# Patient Record
Sex: Male | Born: 1935 | Race: White | Hispanic: No | Marital: Married | Smoking: Former smoker
Health system: Southern US, Community
[De-identification: ages and names within clinical notes are randomized; demographics above are authoritative.]

## PROBLEM LIST (undated history)

## (undated) DIAGNOSIS — C61 Malignant neoplasm of prostate: Secondary | ICD-10-CM

## (undated) DIAGNOSIS — I10 Essential (primary) hypertension: Secondary | ICD-10-CM

## (undated) DIAGNOSIS — N289 Disorder of kidney and ureter, unspecified: Secondary | ICD-10-CM

## (undated) DIAGNOSIS — I4892 Unspecified atrial flutter: Secondary | ICD-10-CM

## (undated) DIAGNOSIS — F0390 Unspecified dementia without behavioral disturbance: Secondary | ICD-10-CM

## (undated) DIAGNOSIS — H3530 Unspecified macular degeneration: Secondary | ICD-10-CM

## (undated) DIAGNOSIS — C439 Malignant melanoma of skin, unspecified: Secondary | ICD-10-CM

## (undated) DIAGNOSIS — N184 Chronic kidney disease, stage 4 (severe): Secondary | ICD-10-CM

## (undated) DIAGNOSIS — K922 Gastrointestinal hemorrhage, unspecified: Secondary | ICD-10-CM

## (undated) DIAGNOSIS — M109 Gout, unspecified: Secondary | ICD-10-CM

## (undated) DIAGNOSIS — Z5189 Encounter for other specified aftercare: Secondary | ICD-10-CM

## (undated) HISTORY — DX: Disorder of kidney and ureter, unspecified: N28.9

## (undated) HISTORY — DX: Chronic kidney disease, stage 4 (severe): N18.4

## (undated) HISTORY — DX: Encounter for other specified aftercare: Z51.89

## (undated) HISTORY — PX: OTHER SURGICAL HISTORY: SHX169

## (undated) HISTORY — DX: Malignant melanoma of skin, unspecified: C43.9

## (undated) HISTORY — DX: Unspecified macular degeneration: H35.30

## (undated) HISTORY — DX: Gout, unspecified: M10.9

## (undated) HISTORY — PX: CARDIAC CATHETERIZATION: SHX172

## (undated) HISTORY — DX: Unspecified atrial flutter: I48.92

## (undated) HISTORY — DX: Essential (primary) hypertension: I10

---

## 2002-07-29 ENCOUNTER — Inpatient Hospital Stay (HOSPITAL_COMMUNITY): Admission: EM | Admit: 2002-07-29 | Discharge: 2002-08-01 | Payer: Self-pay

## 2002-07-29 ENCOUNTER — Encounter (INDEPENDENT_AMBULATORY_CARE_PROVIDER_SITE_OTHER): Payer: Self-pay | Admitting: *Deleted

## 2002-07-31 ENCOUNTER — Encounter (INDEPENDENT_AMBULATORY_CARE_PROVIDER_SITE_OTHER): Payer: Self-pay | Admitting: *Deleted

## 2002-08-01 ENCOUNTER — Encounter: Payer: Self-pay | Admitting: Gastroenterology

## 2002-09-09 ENCOUNTER — Ambulatory Visit (HOSPITAL_COMMUNITY): Admission: RE | Admit: 2002-09-09 | Discharge: 2002-09-09 | Payer: Self-pay | Admitting: Urology

## 2002-09-09 ENCOUNTER — Encounter: Payer: Self-pay | Admitting: Urology

## 2010-01-17 ENCOUNTER — Encounter (HOSPITAL_COMMUNITY): Admission: RE | Admit: 2010-01-17 | Discharge: 2010-02-10 | Payer: Self-pay | Admitting: Nephrology

## 2010-05-22 DIAGNOSIS — C61 Malignant neoplasm of prostate: Secondary | ICD-10-CM

## 2010-05-22 HISTORY — DX: Malignant neoplasm of prostate: C61

## 2010-10-07 NOTE — H&P (Signed)
NAME:  Anthony Sparks, Anthony Sparks NO.:  [id number]   MEDICAL RECORD NO.:  [id number]                   PATIENT TYPE:  INP   LOCATION:  1823                                 FACILITY:  [facility]   PHYSICIAN:  Barbette Hair. Arlyce Dice, M.D. [facility]          DATE OF BIRTH:  11/30/1926   DATE OF ADMISSION:  07/29/2002  DATE OF DISCHARGE:                                HISTORY & PHYSICAL   CHIEF COMPLAINT:  Throwing up blood and black stools with weakness and  dizziness.   HISTORY OF PRESENT ILLNESS:  Dr. Vear Clock contacted [facility] to evaluate  and probably admit the patient, who was being seen in his office earlier  today.  The patient does not go to the doctor much, nor does he offer much  in the way of complaints to his primary physician regarding chronic medical  issues such as his 25-pound weight loss over the last few months, anorexia.  He was seen by Dr. Loma Sender, his primary physician, about six weeks  ago because of a gout flare in his feet, and he was given a 10-day course of  some type of medication, question as to whether this was prednisone or  colchicine or maybe even Indocin.  At any rate, the gout went away, and he  is no longer taking whatever this medication was.  He does use about 12  Advil daily for at least a year or more because of musculoskeletal pains and  occasional headaches.  He also uses Norvasc for hypertension.   Over the weekend the patient vomited and saw blood mixed in with the emesis.  Today he threw up pure clots of hematemesis and had some tarry stools.  He  developed dizziness and presyncope but did not have any syncopal spells.  Yesterday he did work, though he was not doing a lot of physical activity.  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  He went to Dr.  Vear Clock today.  Dr. Vear Clock noted pallor and, given the history, he called  [facility] and sent the patient over for evaluation in the emergency room at  [facility].  In the emergency room his hemoglobin is about 4.5.  Two IVs  are now in place.   The patient's wife says that several weeks back he had stomach pains,  especially postprandially.  A relative of theirs gave him about a week's  worth of Nexium, and the symptoms disappeared and did not return but, again,  he has had some anorexia and weight loss.   PAST MEDICAL HISTORY:  1. He has never been hospitalized.  2. Hypertension.  3. Gout.   PAST SURGICAL HISTORY:  No prior surgeries.   MEDICATIONS:  1. Norvasc 5 mg p.o. daily.  2. Advil 200 mg, up to 12 p.o. daily.  3. No vitamins, no herbs, no Viagra use.   SOCIAL HISTORY:  He works and lives in  [place].  He is married.  He quit  smoking tobacco over 20 years ago.  He admits to moderate alcohol use, about  once to twice a month, mostly vodka when he does drink.   FAMILY HISTORY:  No GI bleeds, no peptic ulcer disease, no cancers.   ALLERGIES:  None reported.   REVIEW OF SYSTEMS:  PULMONARY:  He does have dyspnea on exertion but no  cough.  No pleuritic pain.  ENT:  Several days ago he had some pain in his  throat and some swelling in the glands around his upper neck, but this has  resolved.  He does have rhinorrhea.  GENITOURINARY:  Admits to nocturia,  about four to five episodes a night.  No history of BPH.  ENDOCRINE:  No  polydipsia.  Has been having chills and tremors, but no history of thyroid  disease.  DERMATOLOGIC:  No history of skin cancer.  HEMATOLOGIC:  No  history of prior bleeding problems or trouble clotting his blood.  MUSCULOSKELETAL:  Does have polyarthralgias in his hips, hands, and knees  especially.  GENERAL:  Anorexia with about a 25-pound weight loss over the  last few months.  All other systems reviewed and negative.   PHYSICAL EXAMINATION:  GENERAL:  The patient is a pale, obese white male.  He is a fair historian.  He is alert and oriented x3.  VITAL SIGNS:  Blood pressure 90/50, respirations  28, pulse 84, temperature  97.  Weight not yet determined.  HEENT:  There is conjunctival pallor.  Extraocular movements are intact.  Oropharynx, tongue is dry, no oral blood.  Teeth are in fair repair, with  dental bridges present.  Pharynx is clear.  NECK:  No JVD, no goiter.  No masses.  HEART:  There is regular rate and rhythm.  No murmurs, rubs, or gallops.  CHEST:  Clear to auscultation and percussion bilaterally.  ABDOMEN:  Nondistended, obese, soft, and nontender.  There is a soft bruit  in the epigastric region.  No pulsatile masses.  Question palpable liver  edge about 3 cm below the costal margin, and no splenomegaly.  RECTAL:  Black stool which is rapidly 4+ heme-positive.  EXTREMITIES:  No cyanosis, clubbing, or edema.  Dorsalis pedis pulses are 2+  bilaterally.  NEUROLOGIC:  There is a resting tremor of the upper extremities.  No  asterixis.  DERMATOLOGIC:  There are some spider angiomata in the anterior chest.  No  palmar erythema.  PSYCHIATRIC:  No emotional lability.  Affect seems within normal limits.   LABORATORY DATA:  Hemoglobin is 4.6, hematocrit is 14.6, white blood cell  count 15.9, platelets 554,000, MCV 70.6.  Left shift present on  differential.  Sodium 141, potassium 4.5, BUN 87, creatinine 2.5, glucose is  181.  Total bilirubin 0.4, alkaline phosphatase 26, ALT is 23, and AST is  21.  PT 16.7, INR 1.4, PTT of 37.  Total CK is 13, MB fraction and troponin  I not available yet.   IMPRESSION:  1. Gastrointestinal bleed, likely secondary to nonsteroidal anti-     inflammatory drug-induced ulcers.  2. Anemia secondary to gastrointestinal bleed, suspect acute on chronic     blood loss leading to anemia because the MCV is low.  3. Advil abuse for treatment of degenerative joint disease.  4. Acute renal insufficiency.  Some of this may be secondary to dehydration    but, also, he has been using a lot of nonsteroidal anti-inflammatory  drugs which may have  affected the kidney function.  5. Hyperglycemia.  The patient has no known history of diabetes mellitus     type 2, but he does have nocturia and possibly may have undiagnosed type     2 diabetes.   PLAN:  Admit to the ICU for continued IV fluids.  Add Protonix.  Will plan  to transfuse with three units of packed red blood cells to start.  Upper  endoscopy will be performed today by Dr. Arlyce Dice.     Brett Canales, P.A. [facility]                    Robert D. Arlyce Dice, M.D. [facility]    SG/MEDQ  D:  07/29/2002  T:  07/29/2002  Job:  [id number]

## 2010-10-07 NOTE — Discharge Summary (Signed)
NAME:  Anthony Sparks, Anthony Sparks NO.:  [id number]   MEDICAL RECORD NO.:  [id number]                   PATIENT TYPE:  INP   LOCATION:  4741                                 FACILITY:  [facility]   PHYSICIAN:  Brett Canales, P.A. [facility]              DATE OF BIRTH:  10-18-1927   DATE OF ADMISSION:  07/29/2002  DATE OF DISCHARGE:  08/01/2002                                 DISCHARGE SUMMARY   ADMISSION DIAGNOSES:  1. Gastrointestinal bleed, likely secondary to nonsteroidal anti-     inflammatory drug induced ulcers.  2. Anemia secondary to gastrointestinal bleed, suspect acute on chronic     blood loss leading  to anemia because of the low MCV.  3. Advil abuse for treatment of pain from degenerative joint disease.  4. Acute renal insufficiency. Some of this may be dehydration related and     prerenal, but he has also been using a lot of nonsteroidal anti-     inflammatories and he may have some chronic renal insufficiency     underlying the acute renal insufficiency.  5. Hyperglycemia in a patient with no known history of diabetes mellitus.  6. History of hypertension.  7. History of gout.   ALLERGIES:  None.   DISCHARGE DIAGNOSES:  1. Gastrointestinal bleed secondary to antral gastric ulcer. Clo testing     negative, therefore ulcer likely secondary to nonsteroidal anti-     inflammatory drug use.  2. Microcytic anemia, status post transfusion with packed red blood cells x5     units. The patient's iron levels were less than  10 and he is to start on     oral iron therapy at discharge.  3. Diverticulosis and colon polyps, removed at colonoscopy. Pathology on the     polyps is pending.  4. Acute renal insufficiency, improved but not resolved. Needs to be     followed up to see if he may have chronic renal insufficiency.  5. Hypertension. The patient's blood pressure initially was hypotensive when     admitted and was normotensive thereafter off of his blood pressure     medications.  6. Osteoarthritis, degenerative joint disease. Pain from this to be managed     for now with acetaminophen, Tylenol as needed. May require narcotic     analgesic as he is not to use any nonsteroidals or aspirin products in     the future.  7. Abdominal bruit, audible in the epigastric region on admission,     ultrasound of the abdomen did not show any abdominal aortic aneurysm. It     did show gallstones and a severe left hydronephrosis.  8. Left hydronephrosis. Needs followup with his primary care physician and     possible referral to a urologist.  9. Hyperglycemia. Will need to be followed up in the office by his primary     care physician.   CONSULTS:  None.  PROCEDURES:  Upper endoscopy and colonoscopy by Dr. Melvia Heaps. On EGD he  had an antral ulcer covered with clot. This was washed with water and no  active bleeding was noted. There was old blood seen in the stomach.  Colonoscopy showed scattered diverticulosis in the left colon into the  sigmoid. Several polyps were encountered, 1 in the transverse, 1 in the  descending and 1 in the sigmoid colon as well as in the cecum. The maximum  size of these was 8 mm. Pathology on these polyps is pending at the time of  this dictation.   HISTORY OF PRESENT ILLNESS:  The patient is a 77 year old white male. He  does not see the doctor often and he uses Advil up to 12 daily for  management of pain and stiffness for arthritis. A couple of days  prior to  admission he developed hematemesis. On the morning of admission he was  throwing up pure clots of blood and developed tarry stools as well as  dizziness and presyncope. He went to see Dr. Vear Clock who referred him to  the emergency room and contacted [facility] to evaluate and admit the  patient.   In the emergency room his hemoglobin measured 4.5 and fluid resuscitation  was begun. There was some report from the wife that the patient had had some  stomach pains a  few weeks back, especially noticeable postprandially. A  relative had given him about a week's worth of Nexium and the symptoms had  resolved.  The patient  had never undergone any kind of endoscopic evaluation of either  the upper or lower GI tract.   LABORATORY DATA:  Initial hemoglobin was 4.6 and it measured 9.2 at  discharge, hematocrit went from 14.6 to 24.5. MCV was low at 70.6. Platelets  were 554,000. PT 16.7, INR 1.4 and PTT 37. Sodium 141, potassium 4.5,  chloride 115, CO2 17, BUN initially 87, on final assay it was 41, creatinine  went from 2.5 to 2.0, glucose ranged 181 on admission down to 101 on repeat  testing. Albumin was low at 3.0. Total bilirubin 0.4, alkaline phosphatase  26, AST 23, ALT 21. CK 13, CK-MB 0.9, troponin I 0.02. Iron level less than  10. The reference range is 42 to 135. CEA level was 1.3.   Ultrasound imaging:  This showed severe left hydronephrosis and  uncomplicated gallstones. There was no abdominal aortic aneurysm. No  mention of any kidney stones or ureteral stones.   Pathology on gastric biopsies showed no presence of H. Pylori. There was  sparse chronic inflammation in obtained tissue, otherwise benign gastric  mucosa and no evidence for neoplasia or cancers.   HOSPITAL COURSE:  The patient was admitted initially to 2100 for close  observation. Transfusions with 3 units of packed red blood cells was ordered  to get stabilize the patient. As these were starting the patient also  underwent emergent upper endoscopy, at which time the source of the bleed  was encountered and that was the antral ulcer. Biopsy for pathology was  obtained. It does not reveal any evidence H. pylori.   The patient's blood pressure was initially in the 90s to low 100s over 40s  to 60s. With fluid resuscitation and transfusions these began to come up  into the 130s over 60s and 70s where he remained. His Norvasc was held  during hospitalization.  The source for the  ulcer was certainly the heavy use of over-the-counter  Advil. This was  obviously discontinued on his admission and he and his wife  were advised that he was  not to restart this or any other nonsteroidal or  aspirin containing medication. It is possible that in the future he could  use a low dose aspirin should it be required but not for several weeks.   The patient had a microcytic anemia so there was some concern that  he may  have some lower GI source for some chronic blood loss and he was  prepped  and underwent colonoscopy to evaluate the colon. It was fortunate that we  did this, because he  had several polyps which were caught in an early small  stage. He also  had diverticulosis noted. Pathology on the polyps again is  pending at the time of this dictation.   Both Dr. Vear Clock and Dr. Arlyce Dice had noted a bruit in the epigastric  region  when he arrived in the emergency room. However, he did not have a cardiac  murmur. Therefore he underwent an ultrasound to evaluate for possible  abdominal aortic aneurysm. Findings on this ultrasound are noted above. By  the time that the results were obtained, the patient had gone home, so he  will need followup for this left hydronephrosis with Dr. Vear Clock and  possible referral.   Regarding management of the patient's arthritis related stiffness and pain,  for now he was  advised it would be OK to use extra strength Tylenol or  arthritis formula Tylenol or just regular Tylenol. If this did not manage  his pain well then he is to contact Dr. Vear Clock  for further management.   The patient was stable once he was volume resuscitated and we got his  hemoglobin up to a more normal range. In fact he felt much better with his  hemoglobin getting into the 7 and 8 range. We did not feel that he required  any further transfusions but would require oral iron therapy as an  outpatient for at least the next 2 to 3 months.   FOLLOW UP:  Followup for this  patient is with Dr. Arlyce Dice on September 02, 2002,  in the office. Because this was an antral ulcer, he should undergo a second  followup upper endoscopy to ensure healing  of the ulcer and to assure [place]  that this is not an occult neoplasia. Regarding other problems, he is to  follow up with Dr. Vear Clock and a phone call will be  made to Dr. Vear Clock'  office so that they can arrange an appointment with the patient.   DISCHARGE INSTRUCTIONS:  Activity wise he was told it would be OK to do  light activity in his greenhouse but no heavy lifting or strenuous activity.  Diet was to be unrestricted.   DISCHARGE MEDICATIONS:  1. Norvasc 5 mg p.o. every day.  2. Tylenol either extra strength or arthritis formula up to 6 extra strength     Tylenol p.o. every day p.r.n. stiffness or pain.  3. Regarding proton pump inhibitor therapy, a prescription for Protonix 40     mg 1 p.o. every day x1 month with 2 refills was provided to the patient.    However, after talking to him he pays for all of his medicines out of     pocket and he was advised that he could get this prescription filled or     as a substitute he could purchase over-the-counter Prilosec 20 mg p.o.     every day  and use this instead and that  this may be a more economical     alternative for him. He was educated that just because of Prilosec is an     over-the-counter medication that it does not mean that it is an     unnecessary medication and he needs to stay on an ulcer healing     medication regimen for the next several months.                                               Brett Canales, P.A. [facility]    SG/MEDQ  D:  08/01/2002  T:  08/02/2002  Job:  [id number]   cc:   Barbette Hair. Arlyce Dice, M.D. [facility]  [address]  [place]  [place] [address]  Fax: [phone]

## 2010-10-17 ENCOUNTER — Emergency Department (HOSPITAL_COMMUNITY): Payer: Medicare Other

## 2010-10-17 ENCOUNTER — Inpatient Hospital Stay (HOSPITAL_COMMUNITY)
Admission: EM | Admit: 2010-10-17 | Discharge: 2010-10-22 | DRG: 554 | Disposition: A | Discharge status: Skilled Nursing Facility | Payer: Medicare Other | Attending: Family Medicine | Admitting: Family Medicine

## 2010-10-17 DIAGNOSIS — M1710 Unilateral primary osteoarthritis, unspecified knee: Secondary | ICD-10-CM | POA: Diagnosis present

## 2010-10-17 DIAGNOSIS — E875 Hyperkalemia: Secondary | ICD-10-CM | POA: Diagnosis present

## 2010-10-17 DIAGNOSIS — Z87891 Personal history of nicotine dependence: Secondary | ICD-10-CM

## 2010-10-17 DIAGNOSIS — M109 Gout, unspecified: Principal | ICD-10-CM | POA: Diagnosis present

## 2010-10-17 DIAGNOSIS — I129 Hypertensive chronic kidney disease with stage 1 through stage 4 chronic kidney disease, or unspecified chronic kidney disease: Secondary | ICD-10-CM | POA: Diagnosis present

## 2010-10-17 DIAGNOSIS — N189 Chronic kidney disease, unspecified: Secondary | ICD-10-CM | POA: Diagnosis present

## 2010-10-17 DIAGNOSIS — M79609 Pain in unspecified limb: Secondary | ICD-10-CM

## 2010-10-17 LAB — URINE MICROSCOPIC-ADD ON

## 2010-10-17 LAB — DIFFERENTIAL
Basophils Absolute: 0 10*3/uL (ref 0.0–0.1)
Basophils Relative: 0 % (ref 0–1)
Eosinophils Absolute: 0.1 10*3/uL (ref 0.0–0.7)
Eosinophils Relative: 0 % (ref 0–5)
Lymphocytes Relative: 16 % (ref 12–46)
Lymphs Abs: 2.2 10*3/uL (ref 0.7–4.0)
Neutro Abs: 8.9 10*3/uL — ABNORMAL HIGH (ref 1.7–7.7)
Neutrophils Relative %: 67 % (ref 43–77)

## 2010-10-17 LAB — CBC
MCH: 32.9 pg (ref 26.0–34.0)
MCHC: 33.8 g/dL (ref 30.0–36.0)
MCV: 97.5 fL (ref 78.0–100.0)
Platelets: 208 10*3/uL (ref 150–400)
RBC: 4.04 MIL/uL — ABNORMAL LOW (ref 4.22–5.81)
RDW: 12.6 % (ref 11.5–15.5)

## 2010-10-17 LAB — COMPREHENSIVE METABOLIC PANEL
ALT: 17 U/L (ref 0–53)
AST: 19 U/L (ref 0–37)
CO2: 22 mEq/L (ref 19–32)
Calcium: 9.9 mg/dL (ref 8.4–10.5)
GFR calc Af Amer: 44 mL/min — ABNORMAL LOW (ref >60)
GFR calc non Af Amer: 36 mL/min — ABNORMAL LOW (ref >60)
Sodium: 135 mEq/L (ref 135–145)

## 2010-10-17 LAB — URINALYSIS, ROUTINE W REFLEX MICROSCOPIC
Specific Gravity, Urine: 1.024 (ref 1.005–1.030)
Urobilinogen, UA: 1 mg/dL (ref 0.0–1.0)
pH: 5.5 (ref 5.0–8.0)

## 2010-10-17 LAB — CK TOTAL AND CKMB (NOT AT ARMC): CK, MB: 1.5 ng/mL (ref 0.3–4.0)

## 2010-10-18 ENCOUNTER — Inpatient Hospital Stay (HOSPITAL_COMMUNITY): Payer: Medicare Other

## 2010-10-18 DIAGNOSIS — M109 Gout, unspecified: Secondary | ICD-10-CM

## 2010-10-18 DIAGNOSIS — N184 Chronic kidney disease, stage 4 (severe): Secondary | ICD-10-CM

## 2010-10-18 LAB — BASIC METABOLIC PANEL
Calcium: 9.1 mg/dL (ref 8.4–10.5)
GFR calc Af Amer: 44 mL/min — ABNORMAL LOW (ref >60)
GFR calc non Af Amer: 36 mL/min — ABNORMAL LOW (ref >60)
Sodium: 136 mEq/L (ref 135–145)

## 2010-10-18 LAB — SEDIMENTATION RATE: Sed Rate: 95 mm/hr — ABNORMAL HIGH (ref 0–16)

## 2010-10-18 LAB — URINE CULTURE: Culture  Setup Time: 201205281536

## 2010-10-18 LAB — CBC
MCHC: 33.3 g/dL (ref 30.0–36.0)
RDW: 12.4 % (ref 11.5–15.5)

## 2010-10-19 DIAGNOSIS — I4891 Unspecified atrial fibrillation: Secondary | ICD-10-CM

## 2010-10-19 LAB — BASIC METABOLIC PANEL
BUN: 41 mg/dL — ABNORMAL HIGH (ref 6–23)
CO2: 23 mEq/L (ref 19–32)
CO2: 20 mEq/L (ref 19–32)
Calcium: 8.9 mg/dL (ref 8.4–10.5)
Chloride: 102 mEq/L (ref 96–112)
Chloride: 104 mEq/L (ref 96–112)
Creatinine, Ser: 2.43 mg/dL — ABNORMAL HIGH (ref 0.4–1.5)
Creatinine, Ser: 2.22 mg/dL — ABNORMAL HIGH (ref 0.4–1.5)
GFR calc Af Amer: 31 mL/min — ABNORMAL LOW (ref >60)
GFR calc Af Amer: 35 mL/min — ABNORMAL LOW (ref >60)
GFR calc non Af Amer: 29 mL/min — ABNORMAL LOW (ref >60)
Glucose, Bld: 157 mg/dL — ABNORMAL HIGH (ref 70–99)
Potassium: 5.5 mEq/L — ABNORMAL HIGH (ref 3.5–5.1)
Potassium: 4.6 mEq/L (ref 3.5–5.1)
Sodium: 135 mEq/L (ref 135–145)

## 2010-10-19 LAB — GRAM STAIN

## 2010-10-19 LAB — CBC
Hemoglobin: 10.8 g/dL — ABNORMAL LOW (ref 13.0–17.0)
MCH: 32.2 pg (ref 26.0–34.0)
MCV: 98.8 fL (ref 78.0–100.0)
RBC: 3.35 MIL/uL — ABNORMAL LOW (ref 4.22–5.81)
WBC: 7.9 10*3/uL (ref 4.0–10.5)

## 2010-10-19 LAB — ROCKY MTN SPOTTED FVR AB, IGG-BLOOD: RMSF IgG: 0.09 IV

## 2010-10-19 LAB — PATHOLOGIST SMEAR REVIEW

## 2010-10-19 LAB — RHEUMATOID FACTOR: Rhuematoid fact SerPl-aCnc: 30 IU/mL — ABNORMAL HIGH (ref <=14)

## 2010-10-19 LAB — SYNOVIAL CELL COUNT + DIFF, W/ CRYSTALS: Lymphocytes-Synovial Fld: 0 % (ref 0–20)

## 2010-10-20 LAB — COMPREHENSIVE METABOLIC PANEL
AST: 22 U/L (ref 0–37)
BUN: 45 mg/dL — ABNORMAL HIGH (ref 6–23)
CO2: 20 mEq/L (ref 19–32)
Calcium: 8.6 mg/dL (ref 8.4–10.5)
Chloride: 106 mEq/L (ref 96–112)
Creatinine, Ser: 2.15 mg/dL — ABNORMAL HIGH (ref 0.4–1.5)
GFR calc Af Amer: 36 mL/min — ABNORMAL LOW (ref >60)
GFR calc non Af Amer: 30 mL/min — ABNORMAL LOW (ref >60)
Glucose, Bld: 134 mg/dL — ABNORMAL HIGH (ref 70–99)
Total Bilirubin: 0.2 mg/dL — ABNORMAL LOW (ref 0.3–1.2)

## 2010-10-20 LAB — FRACTIONAL EXCRET-NA(24HR UR +SERUM)
Creatinine, Ser: 1.12 mg/dL (ref 0.40–1.50)
Fractional Excretion of Na: 0 %
Sodium, Ur: 40 mEq/L
Sodium: 140 mEq/L (ref 135–145)

## 2010-10-20 LAB — CBC
Hemoglobin: 11 g/dL — ABNORMAL LOW (ref 13.0–17.0)
MCH: 32.4 pg (ref 26.0–34.0)
MCHC: 33.1 g/dL (ref 30.0–36.0)
MCV: 97.9 fL (ref 78.0–100.0)
RBC: 3.39 MIL/uL — ABNORMAL LOW (ref 4.22–5.81)

## 2010-10-21 LAB — BASIC METABOLIC PANEL
BUN: 42 mg/dL — ABNORMAL HIGH (ref 6–23)
CO2: 20 mEq/L (ref 19–32)
Calcium: 8.4 mg/dL (ref 8.4–10.5)
GFR calc non Af Amer: 31 mL/min — ABNORMAL LOW (ref >60)
Glucose, Bld: 111 mg/dL — ABNORMAL HIGH (ref 70–99)
Sodium: 136 mEq/L (ref 135–145)

## 2010-10-22 LAB — BASIC METABOLIC PANEL
BUN: 38 mg/dL — ABNORMAL HIGH (ref 6–23)
Creatinine, Ser: 1.8 mg/dL — ABNORMAL HIGH (ref 0.4–1.5)
GFR calc non Af Amer: 36 mL/min — ABNORMAL LOW (ref >60)
Glucose, Bld: 141 mg/dL — ABNORMAL HIGH (ref 70–99)
Potassium: 4.9 mEq/L (ref 3.5–5.1)

## 2010-10-22 LAB — BODY FLUID CULTURE: Culture: NO GROWTH

## 2010-10-23 NOTE — H&P (Signed)
NAME:  Anthony Sparks, Anthony Sparks NO.:  [id number]  MEDICAL RECORD NO.:  [id number]           PATIENT TYPE:  E  LOCATION:  [facility]                         FACILITY:  [facility]  PHYSICIAN:  Vinton Layson A. Sheffield Slider, M.D.    DATE OF BIRTH:  1928/04/17  DATE OF ADMISSION:  10/17/2010 DATE OF DISCHARGE:                             HISTORY & PHYSICAL   PRIMARY CARE PROVIDER:  Loma Sender, [place], in [place].  ORTHOPEDIST:  Veverly Fells. Ophelia Charter, MD  ONCOLOGIST/UROLOGIST:  Sigmund I. Patsi Sears, MD, in [place].  NEPHROLOGIST:  Cecille Aver, MD, at [facility] in [place].  CHIEF COMPLAINT:  Knee pain and weakness.  HISTORY OF PRESENT ILLNESS:  This is a 77 year old man who has been unable to walk since his knee gave away and he fell 2 days ago.  Before his knee gave away, the patient was having swelling in his left knee. Then 2 days ago, he felt his knee gave away and he fell on his buttocks. He denies any dizziness, headache, lightheadedness, changes in vision when he fell.  Denies hitting his bed when he fell.  He remained on the floor for a few hours since his wife was unable to lift him.  His grandson came and helped him into a chair where he has been sitting for the past couple of days.  He has been urinating in a bottle from the chair.  Since this fall, he has had worsening pain in his left knee and now pain in his right knee.  Pain is 10/10 and worse in his left.  The few Tylenol and oxycodone he has taken has helped the pain some but not much.  Moving his legs makes the pain worse.  Denies numbness or paresthesias in his extremities.  He used to be able to ambulate mostly unassisted.  He used a walker occasionally.  He was ambulating independently when he fell a couple of days ago.  Did not take any of his medications today.  Regarding osteoarthritis, the patient gets steroid injections periodically.  Received his first dose in the  summer of 2011, second January 2012, and scheduled for another injection tomorrow.  REVIEW OF SYSTEMS:  Denies urinary or stool incontinence.  Last bowel movement before his fall.  Denies fevers or chills.  He has had decreased p.o. intake since his fall.  He has eaten little but has been drinking milk, water, and other liquids.  He has had decreased appetite. Denies dyspnea, chest pain, or rashes.  He gets occasional headaches alleviated by Tylenol.  He has a headache now.  ALLERGIES:  SULFA DRUGS cause rash.  MEDICATIONS:   Allopurinol 100mg  po qd Vicodin 5/325mg  po q4-6hrs prn pain Nifedepine ER 30mg  po qd Propranolol 20mg  po qd for hand tremors  PAST MEDICAL HISTORY: 1. Hypertension. 2. Gout. 3. History of atrial fibrillation per ED physician, although the     patient and his wife were aware of this. 4. History of GI bleed secondary to NSAID use requiring transfusion of     3 units packed red blood cells in 2004. 5. Poor left renal function.  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 6. Prostate cancer being monitored with no active intervention     currently.  No history of diabetes or cardiac disease.  PAST SURGICAL HISTORY:  None.  SOCIAL HISTORY:  Lives with his wife in [place].  Quit smoking 25 years ago.  Had smoked for 20 years at that time.  Drinks 2-3 shots of vodka every day to every other day.  Denies other drugs.  FAMILY HISTORY:  No history of diabetes or heart attacks or cancer.  PHYSICAL EXAMINATION:  VITAL SIGNS:  Temperature 98.6, heart rate 96-99, blood pressure 144/76, respiratory rate 17, 99% on room air. GENERAL:  Not apparent distress. HEENT:  Moist mucous membranes, pinpoint pupils but reactive, extraocular movements intact. CARDIOVASCULAR:  Distant heart sounds, regular rate and rhythm, no thrills, 2+ radial pulses, 1+ pedal pulses. PULMONARY:  No increased work of  breathing, clear to auscultation bilaterally. ABDOMEN:  Obese, rash distended, normoactive bowel sounds, nontender. EXTREMITIES:  Pitting 2+ edema and pretibial edema half way up shins, difficult to assess range of motion of kidney due to pain with passive flexion, sensation intact, able to move toes, able to lift right lower extremity slightly off bed but will not lift left.  Both knees warm, nonerythematous with swelling bilaterally with left greater than right. No calf tenderness to palpation. NEUROLOGIC:  Appropriate to questions, alert and oriented.  LABORATORY DATA AND PROCEDURES:  Sodium 135, potassium 4.3, chloride 99, bicarb 22, glucose 115, BUN 29, creatinine 1.80.  Alk phos 51, AST 19, ALT 17, total protein 7.9, albumin 3.2, calcium 9.9.  CBC, white blood count 13.2, hemoglobin 13.3, platelets 208.  D-dimer 2.31.  BNP 1225.0. Uric acid 5.6.  Urinalysis significant for small bilirubin, 100 protein. Chest x-ray normal.  X-ray of left knee osteopenia with cortical irregularity at the lateral tibial plateau which may indicate fracture. CT of the left knee negative for fracture.  Large joint effusion with degenerative changes.  Lower extremity Dopplers negative for DVT bilaterally.  ECG, heart rate 97, sinus rhythm, PR 172, QRS 94, QTc 404, no ST changes or T-wave inversions.  ASSESSMENT AND PLAN:  This is an 77 year old male with a history of hypertension, osteoarthritis/degenerative joint disease in bilateral knees, and gout, presenting with fall after his left leg gave away 2 days ago with bilateral lower extremity weakness and pain in bilateral knees with left greater than right. 1. Bilateral knee pain and lower extremity weakness.  Likely weakness     on physical exam secondary to knee pain.  The patient appears to     have severe DJD in both knees now with definitive effusion in left     knee based on CT and possible effusion in the right knee.  He may     also have a tear  in his left knee causing him significant pain.     His weakness/inability to ambulate is likely secondary to his pain     and deconditioning.  We will try to pain controlled with Tylenol     650 mg q.8 hours scheduled for now.  The patient has had a history     of hospitalization in 2004 for a GI bleed secondary to NSAID use.     We will be wary of this.  We will give Dilaudid 2 mg IV q.2 h.     p.r.n. breakthrough pain.  We will consider adding Toradol  IV for     inflammation.  We will request PT/OT evaluation.  We will get CT     without contrast to evaluate for any bony abnormality in hips and     pelvis. 2. Chronic renal insufficiency.  Baseline creatinine seems to be     around 2.0. 3. Hypertension on Procardia and propranolol at home.  Unclear of     dosages.  We will give hydralazine p.r.n.  We will give carvedilol     6.25 mg p.o. b.i.d. for now for elevated heart rate and     questionable history of AFib.  I have asked family to bring     medications to reconciliate. 4. History of gout.  We will give low-dose allopurinol 100 mg daily     until he know his current dosages. 5. Fluids, electrolytes, nutrition, gastrointestinal.  We will give IV     fluids at [facility] secondary to his elevated BMP and lower extremity     edema concerning for CHF.  Eat a regular, heart-healthy diet. 6. Elevated BMP, lower extremity edema concerning for congestive heart     failure exacerbation.  The patient has no history of cardiac     disease and no previous echo.  We will get 2-D echo.  We will     monitor strict I's and O's and daily weights.  We will consider     starting the patient on diuretic. 7. History of prostate cancer.  No difficulty urinating currently.  We     will monitor urine output. 8. History of osteoarthritis/degenerative joint disease.  See #1. 9. Prophylaxis.  Heparin 5000 units subcu t.i.d. for DVT prophylaxis.     Protonix for GERD prophylaxis. 10.Disposition.  Pending clinical  improvement.    ______________________________ Priscella Mann, MD   ______________________________ Arnette Norris Sheffield Slider, M.D.    AO/MEDQ  D:  10/17/2010  T:  10/18/2010  Job:  [id number]  Electronically Signed by Priscella Mann MD on 10/19/2010 12:27:32 PM Electronically Signed by Zachery Dauer M.D. on 10/23/2010 06:36:11 PM

## 2010-10-24 LAB — CULTURE, BLOOD (ROUTINE X 2)
Culture  Setup Time: 201205291031
Culture: NO GROWTH

## 2010-11-07 NOTE — Discharge Summary (Signed)
NAME:  Anthony Sparks, GULYAS NO.:  [id number]  MEDICAL RECORD NO.:  [id number]  LOCATION:  [facility]                         FACILITY:  [facility]  PHYSICIAN:  Leighton Roach McDiarmid, M.D.DATE OF BIRTH:  Jun 28, 1927  DATE OF ADMISSION:  10/17/2010 DATE OF DISCHARGE:                              DISCHARGE SUMMARY   PRIMARY CARE PROVIDER:  Dr. Vear Clock in [place], [place].  ORTHOPEDIST:  Veverly Fells. Ophelia Charter, MD at [facility].  REASON FOR HOSPITALIZATION:  Worsening bilateral knee pain.  DISCHARGE DIAGNOSES: 1. Severe gout exacerbation in bilateral knee, worse on left. 2. Acute on chronic renal insufficiency secondary to dehydration. 3. History of hypertension.  NEW MEDICATIONS: 1. Oxycodone 5 mg p.o. q.6 h. p.r.n. knee pain. 2. Prednisone 60 mg p.o. daily with a meal. 3. Tramadol 50 mg p.o. t.i.d.  CONTINUE HOME MEDICATIONS: 1. Allopurinol 100 mg p.o. daily. 2. Vicodin 1 tablet p.o. q.6 h. p.r.n. pain. 3. Nifedipine XR 30 mg p.o. daily. 4. Propranolol 20 mg p.o. q.a.m.  CONSULTS:  Dr. Ophelia Charter at [facility].  PROCEDURES:  Aspiration of left knee effusion on Oct 19, 2010, with aspirate showing cloudy yellow fluid, abandon PMNs, but no organisms, monosodium urate crystals.  PERTINENT LABORATORY DATA:  Uric acid 5.6 within normal limits.  RMSF titer negative.  Blood cultures negative x2.  ESR 95, CRP 28, less than 1%.  BRIEF HOSPITAL COURSE:  This is an 77 year old male presenting with bilateral knee pain and effusion worse on the left secondary to gout flare. 1. Severe gout exacerbation in bilateral knees, worse on left.     Conditions are in for gout/pseudogout versus septic arthritis     versus hemarthrosis secondary to fall before admission "the     patient's left knee gave when he fell on his buttock" on admission.     The patient admitted to floor bed with telemetry secondary to     questionable history of atrial fibrillation.  No events on    telemetry.  For pain, given Dilaudid 1 mg q.2 h. p.r.n. initially.     Pain is still poorly controlled by hospital day 2.  [place]     Orthopedics consulted.  Dr. Ophelia Charter performed aspiration of left knee     effusion which was more marked than right knee effusion which was     mild.  Fluid studies significant for gout.  All the serum uric acid     levels were within normal limits.  The patient started on     prednisone 30 mg and received a steroid injection in his left knee     on hospital day 4.  Transitioned from Dilaudid IV to tramadol and     Tylenol scheduled for pain and oxycodone for breakthrough.     Nutrition consulted regarding low-purine diet and the patient     advised to limit alcohol intake to prevent flares.  Had not been     taking allopurinol regularly "about every 3 days" secondary to the     patient not wanting to take pills.  Restarted on this medication     and advised compliance.  We will continue prednisone increased to     60 mg since  pain in left knee persisted, although pain in right     knee improved.  We will continue high-dose prednisone until about 5     days after flare resolved.  Then, transitioned to prednisone 10 mg.     The patient could not afford colchicine as an outpatient, but was     given 2 doses in the hospital.  No NSAIDs were used secondary to     chronic renal insufficiency.  PT, OT consulted who recommended SNF     and rehab PT, OT.  The patient will be discharged to SNF for     rehabilitation. 2. Acute on chronic renal insufficiency secondary to dehydration.     Baseline creatinine 2.0.  The patient was essentially only one     functioning kidney.  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  Followed by Dr.     Marjory Sneddon at [facility].  Creatinine on     admission 1.80, however, elevated to 2.43 on hospital day 3     secondary to poor p.o. intake and decreased IV  fluids.  Acute renal     insufficiency resolved with IV fluids and encouragement of p.o.     intake, which improved after pain in knees for better control.     Creatinine at the time of discharge was 1.80. 3. History of hypertension.  Stable on home Procardia. 4. History of questionable atrial fibrillation.  The sign out the     [facility] Medicine Teaching Service received from the emergency     department was that the patient has a history of atrial     fibrillation.  The patient nor his wife were aware of this.  The     patient was watched on telemetry and ECGs were acquired x2 which     were normal with no signs of atrial fibrillation.  Telemetry was     discontinued on hospital day 4.  An 2-D echo was normal with an     ejection fraction of 60-65%.  DISPOSITION:  The patient was discharged to SNF for rehabilitation in stable medical condition.  DISCHARGE INSTRUCTIONS: 1. Follow up with Dr. Ophelia Charter in 2 weeks.  Call for an appointment on     Monday October 24, 2010. 2. Follow up with primary care provider, Dr. Vear Clock in 1-2 weeks.     Call for appointment on Monday October 24, 2010.  FOLLOWUP ISSUES:  Please to PCP, Dr. Vear Clock or Dr. Ophelia Charter, please lower the patient's prednisone dose from 60 mg to 10 mg about 5 days after the patient's flare resolved.  He was given a prescription for a 2 weeks worth of the high-dose prednisone.  Hopefully, the patient will have an appointment with Dr. Ophelia Charter or his PCP within 2 weeks.  I recommend keeping the patient on prednisone 10 mg for the next several weeks as allopurinol takes into effect and uric acid and serum uric acid levels stabilize.    ______________________________ Priscella Mann, MD   ______________________________ Leighton Roach McDiarmid, M.D.    AO/MEDQ  D:  10/22/2010  T:  10/22/2010  Job:  [id number]  Electronically Signed by Priscella Mann MD on 11/01/2010 03:04:06 PM Electronically Signed by Acquanetta Belling M.D. on 11/07/2010 12:37:44  PM

## 2014-06-04 ENCOUNTER — Encounter: Payer: Self-pay | Admitting: Gastroenterology

## 2014-09-19 ENCOUNTER — Emergency Department (HOSPITAL_COMMUNITY): Payer: Medicare HMO

## 2014-09-19 ENCOUNTER — Inpatient Hospital Stay (HOSPITAL_COMMUNITY)
Admission: EM | Admit: 2014-09-19 | Discharge: 2014-09-25 | DRG: 377 | Disposition: A | Discharge status: Skilled Nursing Facility | Payer: Medicare HMO | Attending: Pulmonary Disease | Admitting: Pulmonary Disease

## 2014-09-19 ENCOUNTER — Encounter (HOSPITAL_COMMUNITY)
Admission: EM | Disposition: A | Discharge status: Skilled Nursing Facility | Payer: Self-pay | Source: Home / Self Care | Attending: Pulmonary Disease

## 2014-09-19 ENCOUNTER — Encounter (HOSPITAL_COMMUNITY): Payer: Self-pay | Admitting: *Deleted

## 2014-09-19 DIAGNOSIS — K2210 Ulcer of esophagus without bleeding: Secondary | ICD-10-CM | POA: Diagnosis present

## 2014-09-19 DIAGNOSIS — I129 Hypertensive chronic kidney disease with stage 1 through stage 4 chronic kidney disease, or unspecified chronic kidney disease: Secondary | ICD-10-CM | POA: Diagnosis present

## 2014-09-19 DIAGNOSIS — R05 Cough: Secondary | ICD-10-CM

## 2014-09-19 DIAGNOSIS — R5381 Other malaise: Secondary | ICD-10-CM | POA: Diagnosis not present

## 2014-09-19 DIAGNOSIS — K449 Diaphragmatic hernia without obstruction or gangrene: Secondary | ICD-10-CM | POA: Diagnosis present

## 2014-09-19 DIAGNOSIS — E875 Hyperkalemia: Secondary | ICD-10-CM | POA: Diagnosis present

## 2014-09-19 DIAGNOSIS — R059 Cough, unspecified: Secondary | ICD-10-CM

## 2014-09-19 DIAGNOSIS — Z4659 Encounter for fitting and adjustment of other gastrointestinal appliance and device: Secondary | ICD-10-CM

## 2014-09-19 DIAGNOSIS — R0902 Hypoxemia: Secondary | ICD-10-CM | POA: Diagnosis not present

## 2014-09-19 DIAGNOSIS — K922 Gastrointestinal hemorrhage, unspecified: Secondary | ICD-10-CM | POA: Diagnosis present

## 2014-09-19 DIAGNOSIS — J9811 Atelectasis: Secondary | ICD-10-CM | POA: Diagnosis present

## 2014-09-19 DIAGNOSIS — Z87891 Personal history of nicotine dependence: Secondary | ICD-10-CM | POA: Diagnosis not present

## 2014-09-19 DIAGNOSIS — T39395A Adverse effect of other nonsteroidal anti-inflammatory drugs [NSAID], initial encounter: Secondary | ICD-10-CM | POA: Diagnosis present

## 2014-09-19 DIAGNOSIS — Z882 Allergy status to sulfonamides status: Secondary | ICD-10-CM | POA: Diagnosis not present

## 2014-09-19 DIAGNOSIS — R579 Shock, unspecified: Secondary | ICD-10-CM | POA: Diagnosis not present

## 2014-09-19 DIAGNOSIS — K921 Melena: Secondary | ICD-10-CM | POA: Diagnosis present

## 2014-09-19 DIAGNOSIS — R578 Other shock: Secondary | ICD-10-CM | POA: Diagnosis present

## 2014-09-19 DIAGNOSIS — N179 Acute kidney failure, unspecified: Secondary | ICD-10-CM | POA: Diagnosis not present

## 2014-09-19 DIAGNOSIS — Z888 Allergy status to other drugs, medicaments and biological substances status: Secondary | ICD-10-CM | POA: Diagnosis not present

## 2014-09-19 DIAGNOSIS — R531 Weakness: Secondary | ICD-10-CM

## 2014-09-19 DIAGNOSIS — Z6826 Body mass index (BMI) 26.0-26.9, adult: Secondary | ICD-10-CM

## 2014-09-19 DIAGNOSIS — K254 Chronic or unspecified gastric ulcer with hemorrhage: Secondary | ICD-10-CM | POA: Diagnosis present

## 2014-09-19 DIAGNOSIS — E669 Obesity, unspecified: Secondary | ICD-10-CM | POA: Diagnosis present

## 2014-09-19 DIAGNOSIS — K264 Chronic or unspecified duodenal ulcer with hemorrhage: Secondary | ICD-10-CM | POA: Diagnosis present

## 2014-09-19 DIAGNOSIS — D62 Acute posthemorrhagic anemia: Secondary | ICD-10-CM | POA: Diagnosis present

## 2014-09-19 DIAGNOSIS — Z8546 Personal history of malignant neoplasm of prostate: Secondary | ICD-10-CM

## 2014-09-19 DIAGNOSIS — N189 Chronic kidney disease, unspecified: Secondary | ICD-10-CM | POA: Diagnosis present

## 2014-09-19 HISTORY — DX: Malignant neoplasm of prostate: C61

## 2014-09-19 HISTORY — DX: Essential (primary) hypertension: I10

## 2014-09-19 HISTORY — PX: ESOPHAGOGASTRODUODENOSCOPY: SHX5428

## 2014-09-19 LAB — POC OCCULT BLOOD, ED: FECAL OCCULT BLD: POSITIVE — AB

## 2014-09-19 LAB — COMPREHENSIVE METABOLIC PANEL
ALBUMIN: 2.3 g/dL — AB (ref 3.5–5.2)
ALK PHOS: 47 U/L (ref 39–117)
ALT: 10 U/L (ref 0–53)
AST: 18 U/L (ref 0–37)
Anion gap: 13 (ref 5–15)
BILIRUBIN TOTAL: 0.8 mg/dL (ref 0.3–1.2)
BUN: 101 mg/dL — ABNORMAL HIGH (ref 6–23)
CHLORIDE: 104 mmol/L (ref 96–112)
CO2: 23 mmol/L (ref 19–32)
Calcium: 8.5 mg/dL (ref 8.4–10.5)
Creatinine, Ser: 3.24 mg/dL — ABNORMAL HIGH (ref 0.50–1.35)
GFR calc Af Amer: 19 mL/min — ABNORMAL LOW (ref >90)
GFR, EST NON AFRICAN AMERICAN: 16 mL/min — AB (ref >90)
Glucose, Bld: 133 mg/dL — ABNORMAL HIGH (ref 70–99)
POTASSIUM: 5.4 mmol/L — AB (ref 3.5–5.1)
Sodium: 140 mmol/L (ref 135–145)
Total Protein: 5.4 g/dL — ABNORMAL LOW (ref 6.0–8.3)

## 2014-09-19 LAB — CBC WITH DIFFERENTIAL/PLATELET
BASOS PCT: 0 % (ref 0–1)
Basophils Absolute: 0 10*3/uL (ref 0.0–0.1)
EOS ABS: 0 10*3/uL (ref 0.0–0.7)
Eosinophils Relative: 0 % (ref 0–5)
HCT: 26.6 % — ABNORMAL LOW (ref 39.0–52.0)
Hemoglobin: 8.6 g/dL — ABNORMAL LOW (ref 13.0–17.0)
Lymphocytes Relative: 18 % (ref 12–46)
Lymphs Abs: 1.9 10*3/uL (ref 0.7–4.0)
MCH: 30.3 pg (ref 26.0–34.0)
MCHC: 32.3 g/dL (ref 30.0–36.0)
MCV: 93.7 fL (ref 78.0–100.0)
MONOS PCT: 7 % (ref 3–12)
Monocytes Absolute: 0.8 10*3/uL (ref 0.1–1.0)
NEUTROS PCT: 75 % (ref 43–77)
Neutro Abs: 8 10*3/uL — ABNORMAL HIGH (ref 1.7–7.7)
PLATELETS: 229 10*3/uL (ref 150–400)
RBC: 2.84 MIL/uL — AB (ref 4.22–5.81)
RDW: 13.7 % (ref 11.5–15.5)
WBC: 10.8 10*3/uL — AB (ref 4.0–10.5)

## 2014-09-19 LAB — CBC
HCT: 37.8 % — ABNORMAL LOW (ref 39.0–52.0)
HEMOGLOBIN: 13 g/dL (ref 13.0–17.0)
MCH: 30.7 pg (ref 26.0–34.0)
MCHC: 34.4 g/dL (ref 30.0–36.0)
MCV: 89.2 fL (ref 78.0–100.0)
PLATELETS: 120 10*3/uL — AB (ref 150–400)
RBC: 4.24 MIL/uL (ref 4.22–5.81)
RDW: 15.8 % — ABNORMAL HIGH (ref 11.5–15.5)
WBC: 7.8 10*3/uL (ref 4.0–10.5)

## 2014-09-19 LAB — I-STAT CHEM 8, ED
BUN: 104 mg/dL — AB (ref 6–23)
CALCIUM ION: 1.14 mmol/L (ref 1.13–1.30)
CHLORIDE: 104 mmol/L (ref 96–112)
CREATININE: 3 mg/dL — AB (ref 0.50–1.35)
GLUCOSE: 131 mg/dL — AB (ref 70–99)
HCT: 26 % — ABNORMAL LOW (ref 39.0–52.0)
Hemoglobin: 8.8 g/dL — ABNORMAL LOW (ref 13.0–17.0)
Potassium: 5.4 mmol/L — ABNORMAL HIGH (ref 3.5–5.1)
Sodium: 139 mmol/L (ref 135–145)
TCO2: 18 mmol/L (ref 0–100)

## 2014-09-19 LAB — I-STAT TROPONIN, ED
TROPONIN I, POC: 0.01 ng/mL (ref 0.00–0.08)
Troponin i, poc: 0.02 ng/mL (ref 0.00–0.08)

## 2014-09-19 LAB — BASIC METABOLIC PANEL
Anion gap: 14 (ref 5–15)
BUN: 102 mg/dL — ABNORMAL HIGH (ref 6–23)
CALCIUM: 8.3 mg/dL — AB (ref 8.4–10.5)
CO2: 20 mmol/L (ref 19–32)
CREATININE: 3.21 mg/dL — AB (ref 0.50–1.35)
Chloride: 103 mmol/L (ref 96–112)
GFR calc Af Amer: 19 mL/min — ABNORMAL LOW (ref >90)
GFR calc non Af Amer: 16 mL/min — ABNORMAL LOW (ref >90)
GLUCOSE: 121 mg/dL — AB (ref 70–99)
Potassium: 5.5 mmol/L — ABNORMAL HIGH (ref 3.5–5.1)
SODIUM: 137 mmol/L (ref 135–145)

## 2014-09-19 LAB — URINALYSIS, ROUTINE W REFLEX MICROSCOPIC
BILIRUBIN URINE: NEGATIVE
Glucose, UA: NEGATIVE mg/dL
Ketones, ur: NEGATIVE mg/dL
LEUKOCYTES UA: NEGATIVE
NITRITE: NEGATIVE
PH: 5.5 (ref 5.0–8.0)
Protein, ur: NEGATIVE mg/dL
SPECIFIC GRAVITY, URINE: 1.017 (ref 1.005–1.030)
UROBILINOGEN UA: 0.2 mg/dL (ref 0.0–1.0)

## 2014-09-19 LAB — URINE MICROSCOPIC-ADD ON

## 2014-09-19 LAB — POTASSIUM: Potassium: 5.1 mmol/L (ref 3.5–5.1)

## 2014-09-19 LAB — ABO/RH: ABO/RH(D): A NEG

## 2014-09-19 LAB — MAGNESIUM: MAGNESIUM: 2.1 mg/dL (ref 1.5–2.5)

## 2014-09-19 LAB — PROTIME-INR
INR: 1.28 (ref 0.00–1.49)
Prothrombin Time: 16.1 seconds — ABNORMAL HIGH (ref 11.6–15.2)

## 2014-09-19 LAB — PHOSPHORUS: PHOSPHORUS: 4.2 mg/dL (ref 2.3–4.6)

## 2014-09-19 LAB — APTT: APTT: 41 s — AB (ref 24–37)

## 2014-09-19 LAB — PREPARE RBC (CROSSMATCH)

## 2014-09-19 SURGERY — EGD (ESOPHAGOGASTRODUODENOSCOPY)
Anesthesia: Moderate Sedation

## 2014-09-19 MED ORDER — SODIUM CHLORIDE 0.9 % IV BOLUS (SEPSIS)
1000.0000 mL | Freq: Once | INTRAVENOUS | Status: AC
  Administered 2014-09-19: 1000 mL via INTRAVENOUS

## 2014-09-19 MED ORDER — SODIUM CHLORIDE 0.9 % IV SOLN
8.0000 mg/h | INTRAVENOUS | Status: DC
  Administered 2014-09-19 – 2014-09-21 (×4): 8 mg/h via INTRAVENOUS
  Filled 2014-09-19 (×10): qty 80

## 2014-09-19 MED ORDER — DOPAMINE-DEXTROSE 3.2-5 MG/ML-% IV SOLN
2.0000 ug/kg/min | Freq: Once | INTRAVENOUS | Status: DC
Start: 2014-09-19 — End: 2014-09-19
  Filled 2014-09-19: qty 250

## 2014-09-19 MED ORDER — SODIUM CHLORIDE 0.9 % IV SOLN: INTRAVENOUS | Status: DC

## 2014-09-19 MED ORDER — CETYLPYRIDINIUM CHLORIDE 0.05 % MT LIQD
7.0000 mL | Freq: Two times a day (BID) | OROMUCOSAL | Status: DC
  Administered 2014-09-19 – 2014-09-21 (×4): 7 mL via OROMUCOSAL

## 2014-09-19 MED ORDER — SODIUM CHLORIDE 0.9 % IV SOLN: 250.0000 mL | INTRAVENOUS | Status: DC | PRN

## 2014-09-19 MED ORDER — FENTANYL CITRATE (PF) 100 MCG/2ML IJ SOLN
INTRAMUSCULAR | Status: AC
  Filled 2014-09-19: qty 2

## 2014-09-19 MED ORDER — SODIUM CHLORIDE 0.9 % IV SOLN: 10.0000 mL/h | Freq: Once | INTRAVENOUS | Status: DC

## 2014-09-19 MED ORDER — PANTOPRAZOLE SODIUM 40 MG IV SOLR: 40.0000 mg | Freq: Two times a day (BID) | INTRAVENOUS | Status: DC

## 2014-09-19 MED ORDER — SODIUM CHLORIDE 0.9 % IV SOLN
INTRAVENOUS | Status: DC
  Administered 2014-09-19 – 2014-09-21 (×4): via INTRAVENOUS

## 2014-09-19 MED ORDER — SODIUM CHLORIDE 0.9 % IV SOLN
Freq: Once | INTRAVENOUS | Status: AC
  Administered 2014-09-19: 22:00:00 via INTRAVENOUS

## 2014-09-19 MED ORDER — ONDANSETRON HCL 4 MG/2ML IJ SOLN: 4.0000 mg | Freq: Four times a day (QID) | INTRAMUSCULAR | Status: DC | PRN

## 2014-09-19 MED ORDER — SODIUM CHLORIDE 0.9 % IV BOLUS (SEPSIS)
2000.0000 mL | Freq: Once | INTRAVENOUS | Status: AC
  Administered 2014-09-19: 2000 mL via INTRAVENOUS

## 2014-09-19 MED ORDER — MIDAZOLAM HCL 10 MG/2ML IJ SOLN
INTRAMUSCULAR | Status: DC | PRN
  Administered 2014-09-19 (×2): 1 mg via INTRAVENOUS

## 2014-09-19 MED ORDER — PANTOPRAZOLE SODIUM 40 MG IV SOLR
80.0000 mg | Freq: Once | INTRAVENOUS | Status: AC
  Administered 2014-09-19: 80 mg via INTRAVENOUS
  Filled 2014-09-19: qty 80

## 2014-09-19 MED ORDER — IPRATROPIUM-ALBUTEROL 0.5-2.5 (3) MG/3ML IN SOLN
3.0000 mL | Freq: Once | RESPIRATORY_TRACT | Status: AC
  Administered 2014-09-19: 3 mL via RESPIRATORY_TRACT
  Filled 2014-09-19: qty 3

## 2014-09-19 MED ORDER — MIDAZOLAM HCL 5 MG/ML IJ SOLN
INTRAMUSCULAR | Status: AC
  Filled 2014-09-19: qty 3

## 2014-09-19 NOTE — ED Notes (Signed)
Endoscopy team at bedside 

## 2014-09-19 NOTE — ED Notes (Signed)
Anthony Sparks Perfect phone number: [phone]

## 2014-09-19 NOTE — Op Note (Addendum)
[facility] [place], [address]   ENDOSCOPY PROCEDURE REPORT  PATIENT: Anthony Sparks, Anthony Sparks  MR#: [id number] BIRTHDATE: [birth year] , 86  yrs. old GENDER: male ENDOSCOPIST: [name], MD REFERRED BY: PROCEDURE DATE:  09-Oct-2014 PROCEDURE:  EGD, diagnostic ASA CLASS:     Class IV INDICATIONS:  melena and GI bleed. MEDICATIONS: Versed 2 mg IV TOPICAL ANESTHETIC: none  DESCRIPTION OF PROCEDURE: After the risks benefits and alternatives of the procedure were thoroughly explained, informed consent was obtained.  The Pentax Gastroscope [id number] endoscope was introduced through the mouth and advanced to the distal portion of the duodenal bulb,  .NG tube discontinued during the procedure.  The instrument was slowly withdrawn as the mucosa was fully examined. Estimated blood loss is zero unless otherwise noted in this procedure report.    Superficial ulceration and erythema in the distal esophagus consistent with distal erosive esophagitis. Multiple tiny ulcers noted in the antrum with pigmented material in them. In the posterior wall of the pre-pyloric channel there was a 1 cm ulcer with brownish-red color in the base and on the anterior wall there was an 8 mm ulcer with a flat red spot seen. Surrounding edema and erythema also noted. In the duodenal bulb there was an 8 mm deep cratered ulcer with functional obstruction at the distal duodenal bulb preventing passage of the endoscope without resistance and due to concern about scope trauma to these ulcers the 2nd portion of the duodenum was not intubated and evaluated. No visible vessel or protuberance seen in any of the examined ulcers. The 2nd portion of the duodenum could not be evaluated as stated above. Retroflexed views revealed a small hiatal hernia.     The scope was then withdrawn from the patient and the procedure completed.  COMPLICATIONS: There were no immediate  complications.  ENDOSCOPIC IMPRESSION:     1. Multiple gastric and duodenal ulcers with bleeding stigmata as stated above. NSAIDs likely source of the ulcers. Suspect source of recent bleeding came from ulcer on the anterior wall but no visible vessel identified and no active bleeding seen. 2. Distal Erosive Esophagitis 3. Hiatal Hernia  RECOMMENDATIONS:     Continue Protonix drip; Volume resuscitation. NPO. Check H. pylori serology and treat if positive   eSigned:  [name], MD 10/09/2014 7:36 PM Revised: 2014-10-09 7:36 PM   CC:  CPT CODES: ICD CODES:  The ICD and CPT codes recommended by this software are interpretations from the data that the clinical staff has captured with the software.  The verification of the translation of this report to the ICD and CPT codes and modifiers is the sole responsibility of the health care institution and practicing physician where this report was generated.  [employer]. will not be held responsible for the validity of the ICD and CPT codes included on this report.  AMA assumes no liability for data contained or not contained herein. CPT is a Designer, television/film set of the [employer].  PATIENT NAME:  Anthony Sparks, Anthony Sparks MR#: [id number]

## 2014-09-19 NOTE — Interval H&P Note (Signed)
History and Physical Interval Note:  09/19/2014 6:51 PM  Anthony Sparks  has presented today for surgery, with the diagnosis of GI Bleed  The various methods of treatment have been discussed with the patient and family. After consideration of risks, benefits and other options for treatment, the patient has consented to  Procedure(s): ESOPHAGOGASTRODUODENOSCOPY (EGD) (N/A) as a surgical intervention .  The patient's history has been reviewed, patient examined, no change in status, stable for surgery.  I have reviewed the patient's chart and labs.  Questions were answered to the patient's satisfaction.     [place] C.

## 2014-09-19 NOTE — ED Notes (Signed)
Emergency release blood received.  Rapid infusor used to transfuse pt  Per MD.

## 2014-09-19 NOTE — Consult Note (Signed)
Referring Provider: Dr. Johnney Killian Primary Care Physician:  No primary care provider on file. Primary Gastroenterologist:  Althia Forts  Reason for Consultation:  GI bleed  HPI: Anthony Sparks is a 81 y.o. male being seen for a consultation due to a GI bleed that started yesterday with a large amount of black vomitus per his wife along with sharp diffuse abdominal pain. He developed black tarry stools today as well as red blood in his diaper. He was hypotensive at 58/37 on presentation. BPs in the 90's - 100's/ 50's - 60's. S/P 4 U emergent release blood under rapid infusion. Denies abdominal pain at this time. Wife denies that he passed out at home. Denies previous history of GI bleeding. History of alcohol use but none in over a year. Son and wife at bedside. History of prostate cancer and not on treatment. Denies NSAIDs or anticoagulation. No history of liver disease. Wife and son at bedside.   Past Medical History  Diagnosis Date  . Prostate cancer 2012  . Hypertension     History reviewed. No pertinent past surgical history.  Prior to Admission medications   Not on File    Scheduled Meds: . [START ON 09/23/2014] pantoprazole (PROTONIX) IV  40 mg Intravenous Q12H   Continuous Infusions: . sodium chloride    . DOPamine    . pantoprozole (PROTONIX) infusion 8 mg/hr (09/19/14 1658)   PRN Meds:.  Allergies as of 09/19/2014 - never reviewed  Allergen Reaction Noted  . Sulfa antibiotics Hives 09/19/2014    History reviewed. No pertinent family history.  History   Social History  . Marital Status: Married    Spouse Name: N/A  . Number of Children: N/A  . Years of Education: N/A   Occupational History  . Not on file.   Social History Main Topics  . Smoking status: Former Research scientist (life sciences)  . Smokeless tobacco: Not on file  . Alcohol Use: No  . Drug Use: No  . Sexual Activity: Not on file   Other Topics Concern  . Not on file   Social History Narrative  . No narrative on  file    Review of Systems: All negative except as stated above in HPI.  Physical Exam: Vital signs: Filed Vitals:   09/19/14 1700  BP: 81/57  Pulse: 63  Temp: 97.8  Resp: 15     General:   Lethargic, pale, +acute distress, obese Head: atraumatic Eyes: anicteric, pupils equal ENT: oropharynx clear Neck: supple, nontender Lungs:  Coarse breath sounds Heart:  Regular rate and rhythm; no murmurs, clicks, rubs,  or gallops. Abdomen: epigastric tenderness with guarding, soft, nondistended, +BS  Rectal:  Deferred Ext: no edema Skin: dry; no rash Neuro: oriented to person not time  GI:  Lab Results:  Recent Labs  09/19/14 1520 09/19/14 1544  WBC 10.8*  --   HGB 8.6* 8.8*  HCT 26.6* 26.0*  PLT 229  --    BMET  Recent Labs  09/19/14 1520 09/19/14 1544  NA 140 139  K 5.4* 5.4*  CL 104 104  CO2 23  --   GLUCOSE 133* 131*  BUN 101* 104*  CREATININE 3.24* 3.00*  CALCIUM 8.5  --    LFT  Recent Labs  09/19/14 1520  PROT 5.4*  ALBUMIN 2.3*  AST 18  ALT 10  ALKPHOS 47  BILITOT 0.8   PT/INR  Recent Labs  09/19/14 1520  LABPROT 16.1*  INR 1.28     Studies/Results: Dg Chest Port 1 [address]  09/19/2014   CLINICAL DATA:  Generalized weakness since last night, GI bleed, cough  EXAM: PORTABLE CHEST - 1 VIEW  COMPARISON:  10/17/2010  FINDINGS: Limited inspiratory effect. Mild cardiac enlargement. Vascular pattern normal. Right lung clear. Left lung essentially clear although retrocardiac portion of left lower lobe not well evaluated.  IMPRESSION: No acute findings except cannot evaluate retrocardiac left lower lobe on single AP view. Consider PA and lateral to characterize this area better.   Electronically Signed   By: Skipper Cliche M.D.   On: 09/19/2014 15:48    Impression/Plan: Upper GI bleed with hemodynamic instability likely due to a peptic ulcer. Doubt varices or AVMs. Dieulafoy lesion possible but less likely. Protonix drip. NPO. Emergent EGD. Continue  volume resuscitation. Risks/benefits of EGD discussed with patient, his son and patient's wife and they agree to proceed.      [place] C.  09/19/2014, 5:31 PM

## 2014-09-19 NOTE — ED Notes (Signed)
2nd unit emergency release blood infusing at this time

## 2014-09-19 NOTE — ED Provider Notes (Signed)
CSN: [id number]     Arrival date & time 09/19/14  1443 History   First MD Initiated Contact with Patient 09/19/14 1502     Chief Complaint  Patient presents with  . Weakness     (Consider location/radiation/quality/duration/timing/severity/associated sxs/prior Treatment) Patient is a 81 y.o. male presenting with weakness. The history is provided by the patient and medical records.  Weakness Associated symptoms include weakness.    This is an 81 year old male with past medical history significant for active prostate cancer not undergoing treatment, single kidney, hypertension, congestive heart failure, presenting to the ED for generalized weakness for the past several days. He states he has had a productive cough without associated fever or chills. He states he does have mild chest discomfort, notably when coughing.  He also admits to coffee ground emesis and bloody stools for the past 24 hours. He is not currently on any type of anticoagulation.  He denies history of GI bleeding in the past.  Denies current abdominal pain.   No chest pain or SOB.   Patient states he wishes to be full code.  History reviewed. No pertinent past medical history. History reviewed. No pertinent past surgical history. History reviewed. No pertinent family history. History  Substance Use Topics  . Smoking status: Not on file  . Smokeless tobacco: Not on file  . Alcohol Use: Not on file    Review of Systems  Neurological: Positive for weakness.  All other systems reviewed and are negative.     Allergies  Review of patient's allergies indicates not on file.  Home Medications   Prior to Admission medications   Not on File   Pulse 101  Temp(Src) 97.9 F (36.6 C) (Rectal)  Resp 22  SpO2 100%   Physical Exam  Constitutional: He is oriented to person, place, and time. He appears well-developed and well-nourished. No distress.  Elderly, pale  HENT:  Head: Normocephalic and atraumatic.   Mouth/Throat: Uvula is midline, oropharynx is clear and moist and mucous membranes are normal.  Dry mucous membranes  Eyes: Conjunctivae and EOM are normal. Pupils are equal, round, and reactive to light.  Conjunctiva pale  Neck: Normal range of motion. Neck supple.  Cardiovascular: Normal rate, regular rhythm and normal heart sounds.   Pulmonary/Chest: Effort normal and breath sounds normal. No respiratory distress. He has no wheezes.  Coarse breath sounds bilaterally  Abdominal: Soft. Bowel sounds are normal. There is no tenderness. There is no guarding.  No focal tenderness or peritonitis  Genitourinary: Guaiac positive stool.  Grossly melanotic stool mixed with bright red blood present at rectum and in adult brief  Musculoskeletal: Normal range of motion. He exhibits no edema.  Neurological: He is alert and oriented to person, place, and time.  Skin: Skin is warm and dry. He is not diaphoretic.  Psychiatric: He has a normal mood and affect.  Nursing note and vitals reviewed.   ED Course  Procedures (including critical care time)  CRITICAL CARE Performed by: Larene Pickett   Total critical care time: 60  Critical care time was exclusive of separately billable procedures and treating other patients.  Critical care was necessary to treat or prevent imminent or life-threatening deterioration.  Critical care was time spent personally by me on the following activities: development of treatment plan with patient and/or surrogate as well as nursing, discussions with consultants, evaluation of patient's response to treatment, examination of patient, obtaining history from patient or surrogate, ordering and performing treatments and interventions, ordering  and review of laboratory studies, ordering and review of radiographic studies, pulse oximetry and re-evaluation of patient's condition.  Medications  0.9 %  sodium chloride infusion (not administered)  pantoprazole (PROTONIX) 80 mg  in sodium chloride 0.9 % 250 mL (0.32 mg/mL) infusion (8 mg/hr Intravenous New Bag/Given 09/19/14 1658)  pantoprazole (PROTONIX) injection 40 mg (not administered)  DOPamine (INTROPIN) 800 mg in dextrose 5 % 250 mL (3.2 mg/mL) infusion (not administered)  sodium chloride 0.9 % bolus 1,000 mL (1,000 mLs Intravenous New Bag/Given 09/19/14 1518)  pantoprazole (PROTONIX) 80 mg in sodium chloride 0.9 % 100 mL IVPB (80 mg Intravenous New Bag/Given 09/19/14 1602)  ipratropium-albuterol (DUONEB) 0.5-2.5 (3) MG/3ML nebulizer solution 3 mL (3 mLs Nebulization Given 09/19/14 1701)    Labs Review Labs Reviewed  CBC WITH DIFFERENTIAL/PLATELET - Abnormal; Notable for the following:    WBC 10.8 (*)    RBC 2.84 (*)    Hemoglobin 8.6 (*)    HCT 26.6 (*)    Neutro Abs 8.0 (*)    All other components within normal limits  COMPREHENSIVE METABOLIC PANEL - Abnormal; Notable for the following:    Potassium 5.4 (*)    Glucose, Bld 133 (*)    BUN 101 (*)    Creatinine, Ser 3.24 (*)    Total Protein 5.4 (*)    Albumin 2.3 (*)    GFR calc non Af Amer 16 (*)    GFR calc Af Amer 19 (*)    All other components within normal limits  APTT - Abnormal; Notable for the following:    aPTT 41 (*)    All other components within normal limits  PROTIME-INR - Abnormal; Notable for the following:    Prothrombin Time 16.1 (*)    All other components within normal limits  CBC - Abnormal; Notable for the following:    HCT 37.8 (*)    RDW 15.8 (*)    Platelets 120 (*)    All other components within normal limits  POC OCCULT BLOOD, ED - Abnormal; Notable for the following:    Fecal Occult Bld POSITIVE (*)    All other components within normal limits  I-STAT CHEM 8, ED - Abnormal; Notable for the following:    Potassium 5.4 (*)    BUN 104 (*)    Creatinine, Ser 3.00 (*)    Glucose, Bld 131 (*)    Hemoglobin 8.8 (*)    HCT 26.0 (*)    All other components within normal limits  POTASSIUM  URINALYSIS, ROUTINE W REFLEX  MICROSCOPIC  CBC  CBC  CBC  BASIC METABOLIC PANEL  MAGNESIUM  PHOSPHORUS  H. PYLORI ANTIBODY, IGG  I-STAT TROPOININ, ED  I-STAT TROPOININ, ED  TYPE AND SCREEN  PREPARE RBC (CROSSMATCH)  PREPARE RBC (CROSSMATCH)  ABO/RH    Imaging Review Dg Chest Port 1 View  09/19/2014   CLINICAL DATA:  Generalized weakness since last night, GI bleed, cough  EXAM: PORTABLE CHEST - 1 VIEW  COMPARISON:  10/17/2010  FINDINGS: Limited inspiratory effect. Mild cardiac enlargement. Vascular pattern normal. Right lung clear. Left lung essentially clear although retrocardiac portion of left lower lobe not well evaluated.  IMPRESSION: No acute findings except cannot evaluate retrocardiac left lower lobe on single AP view. Consider PA and lateral to characterize this area better.   Electronically Signed   By: Skipper Cliche M.D.   On: 09/19/2014 15:48   Dg Abd Portable 1v  09/19/2014   CLINICAL DATA:  NG tube placement  EXAM: PORTABLE ABDOMEN - 1 VIEW  COMPARISON:  None.  FINDINGS: Enteric tube terminates in the gastric cardia.  None partum bowel gas pattern.  Multiple line/tubes/leads overly the patient.  IMPRESSION: Enteric tube terminates in the gastric cardia.   Electronically Signed   By: Julian Hy M.D.   On: 09/19/2014 18:12     EKG Interpretation None      MDM   Final diagnoses:  Generalized weakness  Encounter for nasogastric (NG) tube placement  Gastrointestinal hemorrhage, unspecified gastritis, unspecified gastrointestinal hemorrhage type  Hyperkalemia  Chronic kidney disease, unspecified stage   81 y.o. M here with generalized weakness.  He is hypotensive and pale.  Rectal exam with gross melanotic stool mixed with bright red blood surrounding rectum and in adult brief.  Patient denies hx of GI blood, not currently on anti-coagulants.  Current BP 758/37.  Work-up pending.  2 units emergent release PRBC's ordered as well as protonix bolus and drip.  Will need critical care input as  well as GI.  Patient and family agree to proceed as FULL CODE for now.  4:53 PM Hemoglobin 8.6, no recent values available for comparison.  Slight hyperkalemia noted as well.  renal function slightly higher than baseline given his known solo kidney.  Case discussed with CCU-- will be in to admit, however no one available to round until 1900.  Automated BP's fluctuating slightly, manual BP 86/62.  Additional 2 units of emergent PRBC's ordered for transfusion.  Dopamine ordered but not infused at the moment.  0530-- Dr. Michail Sermon at bedside and has evaluated patient.  He will scope here in ED to determine source of bleeding.  Pressures have responded well to additional units of blood, now 112/64.  Will continue to hold off on dopamine for now.   54-- Dr. Michail Sermon has performed EGD-- multiple ulcers noted without active bleeding.  No intervention at this time.  Will keep NPO with protonix drip, volume repletion.  BP beginning to soften again, critical care MD Ancil Linsey) at bedside at this time, he will admit for further management.  Larene Pickett, PA-C 09/19/14 2032  Charlesetta Shanks, MD 09/21/14 [phone]

## 2014-09-19 NOTE — ED Notes (Signed)
3rd unit rapid release blood infusing at this time.

## 2014-09-19 NOTE — ED Notes (Signed)
Paged PCCM to [phone]

## 2014-09-19 NOTE — ED Notes (Signed)
Consent signed for future blood transfusions as well as for esophogogastroduadoscopy

## 2014-09-19 NOTE — Progress Notes (Signed)
eLink Physician-Brief Progress Note Patient Name: Anthony Sparks DOB: March 20, 1928 MRN: [id number]   Date of Service  09/19/2014  HPI/Events of Note  Bedside nurse requests admission order clarification.  eICU Interventions  Admission orders clarified.      Intervention Category Minor Interventions: Routine modifications to care plan (e.g. PRN medications for pain, fever)  Anthony Sparks 09/19/2014, 9:38 PM

## 2014-09-19 NOTE — ED Notes (Signed)
Pt remains alert and oriented x's 3.  Color remains pale, skin warm and dry.  Family at bedside.

## 2014-09-19 NOTE — H&P (View-Only) (Signed)
Referring Provider: Dr. Johnney Killian Primary Care Physician:  No primary care provider on file. Primary Gastroenterologist:  Althia Forts  Reason for Consultation:  GI bleed  HPI: Anthony Sparks is a 81 y.o. male being seen for a consultation due to a GI bleed that started yesterday with a large amount of black vomitus per his wife along with sharp diffuse abdominal pain. He developed black tarry stools today as well as red blood in his diaper. He was hypotensive at 58/37 on presentation. BPs in the 90's - 100's/ 50's - 60's. S/P 4 U emergent release blood under rapid infusion. Denies abdominal pain at this time. Wife denies that he passed out at home. Denies previous history of GI bleeding. History of alcohol use but none in over a year. Son and wife at bedside. History of prostate cancer and not on treatment. Denies NSAIDs or anticoagulation. No history of liver disease. Wife and son at bedside.   Past Medical History  Diagnosis Date  . Prostate cancer 2012  . Hypertension     History reviewed. No pertinent past surgical history.  Prior to Admission medications   Not on File    Scheduled Meds: . [START ON 09/23/2014] pantoprazole (PROTONIX) IV  40 mg Intravenous Q12H   Continuous Infusions: . sodium chloride    . DOPamine    . pantoprozole (PROTONIX) infusion 8 mg/hr (09/19/14 1658)   PRN Meds:.  Allergies as of 09/19/2014 - never reviewed  Allergen Reaction Noted  . Sulfa antibiotics Hives 09/19/2014    History reviewed. No pertinent family history.  History   Social History  . Marital Status: Married    Spouse Name: N/A  . Number of Children: N/A  . Years of Education: N/A   Occupational History  . Not on file.   Social History Main Topics  . Smoking status: Former Research scientist (life sciences)  . Smokeless tobacco: Not on file  . Alcohol Use: No  . Drug Use: No  . Sexual Activity: Not on file   Other Topics Concern  . Not on file   Social History Narrative  . No narrative on  file    Review of Systems: All negative except as stated above in HPI.  Physical Exam: Vital signs: Filed Vitals:   09/19/14 1700  BP: 81/57  Pulse: 63  Temp: 97.8  Resp: 15     General:   Lethargic, pale, +acute distress, obese Head: atraumatic Eyes: anicteric, pupils equal ENT: oropharynx clear Neck: supple, nontender Lungs:  Coarse breath sounds Heart:  Regular rate and rhythm; no murmurs, clicks, rubs,  or gallops. Abdomen: epigastric tenderness with guarding, soft, nondistended, +BS  Rectal:  Deferred Ext: no edema Skin: dry; no rash Neuro: oriented to person not time  GI:  Lab Results:  Recent Labs  09/19/14 1520 09/19/14 1544  WBC 10.8*  --   HGB 8.6* 8.8*  HCT 26.6* 26.0*  PLT 229  --    BMET  Recent Labs  09/19/14 1520 09/19/14 1544  NA 140 139  K 5.4* 5.4*  CL 104 104  CO2 23  --   GLUCOSE 133* 131*  BUN 101* 104*  CREATININE 3.24* 3.00*  CALCIUM 8.5  --    LFT  Recent Labs  09/19/14 1520  PROT 5.4*  ALBUMIN 2.3*  AST 18  ALT 10  ALKPHOS 47  BILITOT 0.8   PT/INR  Recent Labs  09/19/14 1520  LABPROT 16.1*  INR 1.28     Studies/Results: Dg Chest Port 1 [address]  09/19/2014   CLINICAL DATA:  Generalized weakness since last night, GI bleed, cough  EXAM: PORTABLE CHEST - 1 VIEW  COMPARISON:  10/17/2010  FINDINGS: Limited inspiratory effect. Mild cardiac enlargement. Vascular pattern normal. Right lung clear. Left lung essentially clear although retrocardiac portion of left lower lobe not well evaluated.  IMPRESSION: No acute findings except cannot evaluate retrocardiac left lower lobe on single AP view. Consider PA and lateral to characterize this area better.   Electronically Signed   By: Skipper Cliche M.D.   On: 09/19/2014 15:48    Impression/Plan: Upper GI bleed with hemodynamic instability likely due to a peptic ulcer. Doubt varices or AVMs. Dieulafoy lesion possible but less likely. Protonix drip. NPO. Emergent EGD. Continue  volume resuscitation. Risks/benefits of EGD discussed with patient, his son and patient's wife and they agree to proceed.      [place] C.  09/19/2014, 5:31 PM

## 2014-09-19 NOTE — ED Notes (Signed)
Pt remains alert and oriented.  Color improving.  Skin remains alert and oriented.

## 2014-09-19 NOTE — ED Notes (Signed)
Pt family states he has generalized weakness since last night, coffee ground loose stools, and is complaining of chills.  Pt has prostate cancer and refuses treatment.  Pt has 1 kidney removed.  Pt has a croupy cough and can't remember when it started.  Pt is A&O x 4

## 2014-09-19 NOTE — Brief Op Note (Addendum)
Multiple gastric ulcers with bleeding stigmata. Several duodenal bulb ulcers. Ulcers likely due to the Ibuprofen he has been taking for pain (denied NSAIDs to me but then admitted to taking large amounts of it for knee pain to one of the nurses and wife says she does not know what he takes). Erosive esophagitis. No active bleeding. See endopro note for details. Keep NPO. Continue Protonix drip. Volume resuscitation.

## 2014-09-19 NOTE — ED Notes (Signed)
Lab unable to draw labs at this time d/t recent blood transfusions,

## 2014-09-20 DIAGNOSIS — R579 Shock, unspecified: Secondary | ICD-10-CM

## 2014-09-20 DIAGNOSIS — R0902 Hypoxemia: Secondary | ICD-10-CM

## 2014-09-20 DIAGNOSIS — K254 Chronic or unspecified gastric ulcer with hemorrhage: Principal | ICD-10-CM

## 2014-09-20 LAB — CBC
HCT: 36.2 % — ABNORMAL LOW (ref 39.0–52.0)
HCT: 37.6 % — ABNORMAL LOW (ref 39.0–52.0)
HCT: 38.3 % — ABNORMAL LOW (ref 39.0–52.0)
HEMATOCRIT: 36.6 % — AB (ref 39.0–52.0)
HEMATOCRIT: 37 % — AB (ref 39.0–52.0)
HEMATOCRIT: 40.3 % (ref 39.0–52.0)
HEMOGLOBIN: 12.7 g/dL — AB (ref 13.0–17.0)
Hemoglobin: 12.8 g/dL — ABNORMAL LOW (ref 13.0–17.0)
Hemoglobin: 13.9 g/dL (ref 13.0–17.0)
Hemoglobin: 12.4 g/dL — ABNORMAL LOW (ref 13.0–17.0)
Hemoglobin: 12.6 g/dL — ABNORMAL LOW (ref 13.0–17.0)
Hemoglobin: 13.1 g/dL (ref 13.0–17.0)
MCH: 30.5 pg (ref 26.0–34.0)
MCH: 29.8 pg (ref 26.0–34.0)
MCH: 30.3 pg (ref 26.0–34.0)
MCH: 30.3 pg (ref 26.0–34.0)
MCH: 29.7 pg (ref 26.0–34.0)
MCH: 30.6 pg (ref 26.0–34.0)
MCHC: 35 g/dL (ref 30.0–36.0)
MCHC: 34.3 g/dL (ref 30.0–36.0)
MCHC: 34.5 g/dL (ref 30.0–36.0)
MCHC: 34.3 g/dL (ref 30.0–36.0)
MCHC: 33.5 g/dL (ref 30.0–36.0)
MCHC: 34.2 g/dL (ref 30.0–36.0)
MCV: 87.1 fL (ref 78.0–100.0)
MCV: 86.9 fL (ref 78.0–100.0)
MCV: 88 fL (ref 78.0–100.0)
MCV: 88.5 fL (ref 78.0–100.0)
MCV: 88.7 fL (ref 78.0–100.0)
MCV: 89.5 fL (ref 78.0–100.0)
PLATELETS: 112 10*3/uL — AB (ref 150–400)
PLATELETS: 107 10*3/uL — AB (ref 150–400)
PLATELETS: 114 10*3/uL — AB (ref 150–400)
PLATELETS: 131 10*3/uL — AB (ref 150–400)
Platelets: 118 10*3/uL — ABNORMAL LOW (ref 150–400)
Platelets: 119 10*3/uL — ABNORMAL LOW (ref 150–400)
RBC: 4.2 MIL/uL — ABNORMAL LOW (ref 4.22–5.81)
RBC: 4.26 MIL/uL (ref 4.22–5.81)
RBC: 4.58 MIL/uL (ref 4.22–5.81)
RBC: 4.09 MIL/uL — AB (ref 4.22–5.81)
RBC: 4.24 MIL/uL (ref 4.22–5.81)
RBC: 4.28 MIL/uL (ref 4.22–5.81)
RDW: 16.7 % — ABNORMAL HIGH (ref 11.5–15.5)
RDW: 17 % — ABNORMAL HIGH (ref 11.5–15.5)
RDW: 17.1 % — AB (ref 11.5–15.5)
RDW: 17.2 % — ABNORMAL HIGH (ref 11.5–15.5)
RDW: 17.2 % — AB (ref 11.5–15.5)
RDW: 16.9 % — ABNORMAL HIGH (ref 11.5–15.5)
WBC: 5.9 10*3/uL (ref 4.0–10.5)
WBC: 5.3 10*3/uL (ref 4.0–10.5)
WBC: 5.7 10*3/uL (ref 4.0–10.5)
WBC: 5.7 10*3/uL (ref 4.0–10.5)
WBC: 6.1 10*3/uL (ref 4.0–10.5)
WBC: 6.1 10*3/uL (ref 4.0–10.5)

## 2014-09-20 LAB — BASIC METABOLIC PANEL
Anion gap: 9 (ref 5–15)
BUN: 80 mg/dL — AB (ref 6–20)
CO2: 20 mmol/L — ABNORMAL LOW (ref 22–32)
CREATININE: 2.73 mg/dL — AB (ref 0.61–1.24)
Calcium: 7.8 mg/dL — ABNORMAL LOW (ref 8.9–10.3)
Chloride: 113 mmol/L — ABNORMAL HIGH (ref 101–111)
GFR calc Af Amer: 23 mL/min — ABNORMAL LOW (ref >60)
GFR calc non Af Amer: 20 mL/min — ABNORMAL LOW (ref >60)
GLUCOSE: 74 mg/dL (ref 70–99)
Potassium: 4.8 mmol/L (ref 3.5–5.1)
Sodium: 142 mmol/L (ref 135–145)

## 2014-09-20 LAB — MRSA PCR SCREENING: MRSA by PCR: NEGATIVE

## 2014-09-20 MED ORDER — DIPHENHYDRAMINE HCL 25 MG PO CAPS
25.0000 mg | ORAL_CAPSULE | Freq: Once | ORAL | Status: AC
  Administered 2014-09-20: 25 mg via ORAL
  Filled 2014-09-20: qty 1

## 2014-09-20 MED ORDER — LORAZEPAM 2 MG/ML IJ SOLN
INTRAMUSCULAR | Status: AC
  Filled 2014-09-20: qty 1

## 2014-09-20 NOTE — H&P (Addendum)
PULMONARY / CRITICAL CARE MEDICINE HISTORY AND PHYSICAL EXAMINATION   Name: Anthony Sparks MRN: [id number] DOB: December 26, 1927    ADMISSION DATE:  09/19/2014  PRIMARY SERVICE: PCCM  CHIEF COMPLAINT:  GI Bleed  BRIEF PATIENT DESCRIPTION: 81 y/o man with lower GI bleed  SIGNIFICANT EVENTS / STUDIES:  EGD in ED which demonstrated ulcers in stomach and deuodenum, including stigmata of recent bleed  LINES / TUBES: 2 PIVs in Bilateral AC  CULTURES: None  ANTIBIOTICS: None  HISTORY OF PRESENT ILLNESS:   81 y/o man with a history of prostate CA, HTN, and renal insuffiency who presented to the ED with a 1 day history of black, tarry stools, and black vomit with abdominal pain. He denied NSAID use, and reported feeling well until recently. No EtOH or liver dz history. No cardiac history. He presented to the ED and was found to be hypotensive. He received 4U PRBCs and GI was called. An EGD was performed which demonstrated multiple gastric and duodenal uclers with stigmata of bleeding, but no active bleed. He was started on a PPI gtt and admitted to the [facility] service.  PAST MEDICAL HISTORY :  Past Medical History  Diagnosis Date  . Prostate cancer 2012  . Hypertension    History reviewed. No pertinent past surgical history. Prior to Admission medications   Medication Sig Start Date End Date Taking? Authorizing Provider  allopurinol (ZYLOPRIM) 300 MG tablet Take 300 mg by mouth daily.   Yes Historical Provider, MD  furosemide (LASIX) 80 MG tablet Take 80 mg by mouth daily.    Yes Historical Provider, MD  ibuprofen (ADVIL,MOTRIN) 200 MG tablet Take 200 mg by mouth every 6 (six) hours as needed for mild pain or moderate pain.   Yes Historical Provider, MD  NIFEdipine (PROCARDIA-XL/ADALAT-CC/NIFEDICAL-XL) 30 MG 24 hr tablet Take 30 mg by mouth daily. 07/07/14  Yes Historical Provider, MD   Allergies  Allergen Reactions  . Augmentin [Amoxicillin-Pot Clavulanate] Nausea Only    Stomach pain  .  Sulfa Antibiotics Hives    FAMILY HISTORY:  History reviewed. No pertinent family history. SOCIAL HISTORY:  reports that he has quit smoking. He does not have any smokeless tobacco history on file. He reports that he does not drink alcohol or use illicit drugs.  REVIEW OF SYSTEMS:  Per HPI.  SUBJECTIVE:   VITAL SIGNS: Temp:  [97.1 F (36.2 C)-97.9 F (36.6 C)] 97.1 F (36.2 C) (05/01 0045) Pulse Rate:  [59-101] 63 (05/01 0045) Resp:  [13-28] 14 (05/01 0045) BP: (58-114)/(37-80) 110/62 mmHg (05/01 0045) SpO2:  [87 %-100 %] 99 % (05/01 0045) Weight:  [174 lb 13.2 oz (79.3 kg)-190 lb (86.183 kg)] 174 lb 13.2 oz (79.3 kg) (04/30 2200) HEMODYNAMICS:   VENTILATOR SETTINGS:   INTAKE / OUTPUT: Intake/Output      04/30 0701 - 05/01 0700   I.V. (mL/kg) 2621.5 (33.1)   Blood 670   IV Piggyback 1000   Total Intake(mL/kg) 4291.5 (54.1)   Urine (mL/kg/hr) 375   Total Output 375   Net +3916.5       Urine Occurrence 2 x     PHYSICAL EXAMINATION (shortly after EGD, sedation): General:  Elderly man, sleeping. Neuro:  sleepy, but readily arousible, moves all extremities. HEENT:  Dried blood around mouth, dry mucus membranes Neck: flat neck veins Cardiovascular:  RRR, ultrasound demonstrates collapsble juglar vein, IVC. Lungs:  CTAB. Abdomen:  Mildly tender to deep palpation Musculoskeletal:  No deformities. Skin:  No rashes  LABS:  CBC  Recent Labs Lab 09/19/14 1520 09/19/14 1544 09/19/14 1850  WBC 10.8*  --  7.8  HGB 8.6* 8.8* 13.0  HCT 26.6* 26.0* 37.8*  PLT 229  --  120*   Coag's  Recent Labs Lab 09/19/14 1520  APTT 41*  INR 1.28   BMET  Recent Labs Lab 09/19/14 1520 09/19/14 1544 09/19/14 1850  NA 140 139 137  K 5.4* 5.4* 5.5*  5.1  CL 104 104 103  CO2 23  --  20  BUN 101* 104* 102*  CREATININE 3.24* 3.00* 3.21*  GLUCOSE 133* 131* 121*   Electrolytes  Recent Labs Lab 09/19/14 1520 09/19/14 1850  CALCIUM 8.5 8.3*  MG  --  2.1  PHOS  --   4.2   Sepsis Markers No results for input(s): LATICACIDVEN, PROCALCITON, O2SATVEN in the last 168 hours. ABG No results for input(s): PHART, PCO2ART, PO2ART in the last 168 hours. Liver Enzymes  Recent Labs Lab 09/19/14 1520  AST 18  ALT 10  ALKPHOS 47  BILITOT 0.8  ALBUMIN 2.3*   Cardiac Enzymes No results for input(s): TROPONINI, PROBNP in the last 168 hours. Glucose No results for input(s): GLUCAP in the last 168 hours.  Imaging Dg Chest Port 1 View  09/19/2014   CLINICAL DATA:  Generalized weakness since last night, GI bleed, cough  EXAM: PORTABLE CHEST - 1 VIEW  COMPARISON:  10/17/2010  FINDINGS: Limited inspiratory effect. Mild cardiac enlargement. Vascular pattern normal. Right lung clear. Left lung essentially clear although retrocardiac portion of left lower lobe not well evaluated.  IMPRESSION: No acute findings except cannot evaluate retrocardiac left lower lobe on single AP view. Consider PA and lateral to characterize this area better.   Electronically Signed   By: Skipper Cliche M.D.   On: 09/19/2014 15:48   Dg Abd Portable 1v  09/19/2014   CLINICAL DATA:  NG tube placement  EXAM: PORTABLE ABDOMEN - 1 VIEW  COMPARISON:  None.  FINDINGS: Enteric tube terminates in the gastric cardia.  None partum bowel gas pattern.  Multiple line/tubes/leads overly the patient.  IMPRESSION: Enteric tube terminates in the gastric cardia.   Electronically Signed   By: Julian Hy M.D.   On: 09/19/2014 18:12    EKG: Not assessed. CXR: enlarged cardiac Sillhoutte .  ASSESSMENT / PLAN:  Active Problems:   GI bleed   GI bleeding   PULMONARY A: Mild hypoxia P:   Wean O2 as tolerated. Diurese once stable x24 hours.  CARDIOVASCULAR A: Hypertension P:   Hold antihypertensives, lasix.  RENAL A: AKI on CKD P: Apparently has congenital component. Hydrate, trend Cr.  GASTROINTESTINAL A: Upper GI Bleed due to ulcers P:  Doing well on PPI gtt, s/p EGD. Trend CBC.  Hydrate to maintain good hemodynamic control.  HEMATOLOGIC A: Anemia due to hemorrhage. P:   S/p 4 U PRBCs, transfuse to maintain Hb > 7.0  INFECTIOUS A: No active issues. P:    ENDOCRINE A: No active issues P:    NEUROLOGIC A: No active issues P:    BEST PRACTICE / DISPOSITION Level of Care:  ICU Primary Service:  PCCM Consultants:  GI Code Status:  Full Diet:  NPO DVT Px:  Held 2/2 bleed GI Px:  PPI gtt Skin Integrity:  Intact Social / Family:  Updated on admission.  TODAY'S SUMMARY: 81 y/o man with significant upper GI Bleed  I have personally obtained a history, examined the patient, evaluated laboratory and imaging results, formulated the assessment and plan  and placed orders.  CRITICAL CARE: The patient is critically ill with multiple organ systems failure and requires high complexity decision making for assessment and support, frequent evaluation and titration of therapies, application of advanced monitoring technologies and extensive interpretation of multiple databases. Critical Care Time devoted to patient care services described in this note is 47 minutes.   Luz Brazen, MD Pulmonary and [place] Pager: [phone]   09/20/2014, 1:06 AM

## 2014-09-20 NOTE — Progress Notes (Signed)
Patient ID: Anthony Sparks, male   DOB: 17-Mar-1928, 81 y.o.   MRN: [id number] [facility] Gastroenterology Progress Note  Raffaele Derise 81 y.o. 09-Aug-1927   Subjective: No BMs and no rectal bleeding overnight. Feels ok. Denies abdominal pain. More alert. Wants to drink Ensure.  Objective: Vital signs in last 24 hours: Filed Vitals:   09/20/14 0800  BP: 99/43  Pulse: 65  Temp: 98.1 F (36.7 C)  Resp: 16    Physical Exam: Gen: alert, no acute distress CV: RRR Chest: CTA anteriorly Abd: soft, nontender, nondistended, +BS  Lab Results:  Recent Labs  09/19/14 1850 09/20/14 0407  NA 137 142  K 5.5*  5.1 4.8  CL 103 113*  CO2 20 20*  GLUCOSE 121* 74  BUN 102* 80*  CREATININE 3.21* 2.73*  CALCIUM 8.3* 7.8*  MG 2.1  --   PHOS 4.2  --     Recent Labs  09/19/14 1520  AST 18  ALT 10  ALKPHOS 47  BILITOT 0.8  PROT 5.4*  ALBUMIN 2.3*    Recent Labs  09/19/14 1520  09/20/14 0407 09/20/14 0800  WBC 10.8*  < > 5.9 5.3  NEUTROABS 8.0*  --   --   --   HGB 8.6*  < > 12.8* 12.7*  HCT 26.6*  < > 36.6* 37.0*  MCV 93.7  < > 87.1 86.9  PLT 229  < > 118* 112*  < > = values in this interval not displayed.  Recent Labs  09/19/14 1520  LABPROT 16.1*  INR 1.28      Assessment/Plan: S/P GI bleed from peptic ulcers likely from NSAIDs. No bleeding overnight. Hgb 12.7. BP in the 70's - 90's /20's - 40's this morning. Continue Protonix drip. Clear liquid diet today including Ensure ok. Check H. Pylori serology and treat if positive. Supportive care. Will follow.   [place] C. 09/20/2014, 9:55 AM

## 2014-09-20 NOTE — Progress Notes (Signed)
Consult to care management. Pt. Says he falls frequently. Lives with his wife. Son and grandson have to come over and get him off the floor. Both knees have abrasions where he has recently fallen. Says he is unable to use a walker due to weak arms. Has a 4 point cane but continues to fall.

## 2014-09-20 NOTE — Progress Notes (Signed)
PULMONARY / CRITICAL CARE MEDICINE HISTORY AND PHYSICAL EXAMINATION   Name: Anthony Sparks MRN: [id number] DOB: [birth year]    ADMISSION DATE:  09/19/2014  PRIMARY SERVICE: PCCM  CHIEF COMPLAINT:  GI Bleed  BRIEF PATIENT DESCRIPTION: 81 y/o man with lower GI bleed  SIGNIFICANT EVENTS / STUDIES:  EGD in ED which demonstrated ulcers in stomach and deuodenum, including stigmata of recent bleed  LINES / TUBES: 2 PIVs in Bilateral AC  CULTURES: None  ANTIBIOTICS: None  HISTORY OF PRESENT ILLNESS:   80 y/o man with a history of prostate CA, HTN, and renal insuffiency who presented to the ED with a 1 day history of black, tarry stools, and black vomit with abdominal pain. He denied NSAID use, and reported feeling well until recently. No EtOH or liver dz history. No cardiac history. He presented to the ED and was found to be hypotensive. He received 4U PRBCs and GI was called. An EGD was performed which demonstrated multiple gastric and duodenal uclers with stigmata of bleeding, but no active bleed. He was started on a PPI gtt and admitted to the [facility] service.  SUBJECTIVE: Remains hypotensive overnight, feels ok but minimal UOP.  VITAL SIGNS: Temp:  [97.1 F (36.2 C)-98.3 F (36.8 C)] 98.1 F (36.7 C) (05/01 0800) Pulse Rate:  [59-101] 65 (05/01 0800) Resp:  [12-28] 16 (05/01 0800) BP: (58-114)/(24-80) 99/43 mmHg (05/01 0800) SpO2:  [87 %-100 %] 99 % (05/01 0800) Weight:  [79.3 kg (174 lb 13.2 oz)-86.183 kg (190 lb)] 79.3 kg (174 lb 13.2 oz) (05/01 0500) HEMODYNAMICS:   VENTILATOR SETTINGS:   INTAKE / OUTPUT: Intake/Output      04/30 0701 - 05/01 0700 05/01 0701 - 05/02 0700   I.V. (mL/kg) 3671.5 (46.3) 150 (1.9)   Blood 670    IV Piggyback 1000    Total Intake(mL/kg) 5341.5 (67.4) 150 (1.9)   Urine (mL/kg/hr) 625 100 (0.5)   Total Output 625 100   Net +4716.5 +50        Urine Occurrence 2 x     PHYSICAL EXAMINATION (shortly after EGD, sedation): General:  Elderly  man, awake and interactive. Neuro:  Awake and interactive, follows all commands HEENT:  [place]/AT, PERRL, EOM-I and MMM, no further blood around the mouth. Neck: Supple, no JVD Cardiovascular: RRR, ultrasound demonstrates collapsble juglar vein, IVC. Lungs: CTA-B. Abdomen:  Mildly tender to deep palpation Musculoskeletal:  No deformities. Skin:  No rashes  LABS:  CBC  Recent Labs Lab 09/19/14 1520 09/19/14 1544 09/19/14 1850 09/20/14 0407  WBC 10.8*  --  7.8 5.9  HGB 8.6* 8.8* 13.0 12.8*  HCT 26.6* 26.0* 37.8* 36.6*  PLT 229  --  120* 118*   Coag's  Recent Labs Lab 09/19/14 1520  APTT 41*  INR 1.28   BMET  Recent Labs Lab 09/19/14 1520 09/19/14 1544 09/19/14 1850 09/20/14 0407  NA 140 139 137 142  K 5.4* 5.4* 5.5*  5.1 4.8  CL 104 104 103 113*  CO2 23  --  20 20*  BUN 101* 104* 102* 80*  CREATININE 3.24* 3.00* 3.21* 2.73*  GLUCOSE 133* 131* 121* 74   Electrolytes  Recent Labs Lab 09/19/14 1520 09/19/14 1850 09/20/14 0407  CALCIUM 8.5 8.3* 7.8*  MG  --  2.1  --   PHOS  --  4.2  --    Sepsis Markers No results for input(s): LATICACIDVEN, PROCALCITON, O2SATVEN in the last 168 hours. ABG No results for input(s): PHART, PCO2ART, PO2ART in the last  168 hours. Liver Enzymes  Recent Labs Lab 09/19/14 1520  AST 18  ALT 10  ALKPHOS 47  BILITOT 0.8  ALBUMIN 2.3*   Cardiac Enzymes No results for input(s): TROPONINI, PROBNP in the last 168 hours. Glucose No results for input(s): GLUCAP in the last 168 hours.  Imaging Dg Chest Port 1 View  09/19/2014   CLINICAL DATA:  Generalized weakness since last night, GI bleed, cough  EXAM: PORTABLE CHEST - 1 VIEW  COMPARISON:  10/17/2010  FINDINGS: Limited inspiratory effect. Mild cardiac enlargement. Vascular pattern normal. Right lung clear. Left lung essentially clear although retrocardiac portion of left lower lobe not well evaluated.  IMPRESSION: No acute findings except cannot evaluate retrocardiac left  lower lobe on single AP view. Consider PA and lateral to characterize this area better.   Electronically Signed   By: Skipper Cliche M.D.   On: 09/19/2014 15:48   Dg Abd Portable 1v  09/19/2014   CLINICAL DATA:  NG tube placement  EXAM: PORTABLE ABDOMEN - 1 VIEW  COMPARISON:  None.  FINDINGS: Enteric tube terminates in the gastric cardia.  None partum bowel gas pattern.  Multiple line/tubes/leads overly the patient.  IMPRESSION: Enteric tube terminates in the gastric cardia.   Electronically Signed   By: Julian Hy M.D.   On: 09/19/2014 18:12   EKG: Not assessed. CXR: enlarged cardiac Sillhoutte .  ASSESSMENT / PLAN:  Active Problems:   GI bleed   GI bleeding   PULMONARY A: Mild hypoxia P:   Titrate O2 for sat of 88-92%. Will need some diureses but BP precludes it.  CARDIOVASCULAR A: Hypotension, hemorrhagic shock. P:   Hold diureses. Hold all anti-HTN. Hydrate for volume. No further blood transfusion. Watch closely for pulmonary edema. Hold in the ICU.  RENAL A: AKI on CKD P: Apparently has congenital component. Hydrate, trend Cr. BMET in AM. Replace electrolytes as indicated.  GASTROINTESTINAL A: Upper GI Bleed due to ulcers P:  Doing well on PPI gtt, s/p EGD.  Trend CBC.  Hydrate to maintain good hemodynamic control. Hold further transfusion. Change CBC to q12 hours.  HEMATOLOGIC A: Anemia due to hemorrhage. P:   S/p 4 U PRBCs, transfuse to maintain Hb > 7.0  INFECTIOUS A: No active issues. P:   Monitor fever curve and WBC.  ENDOCRINE A: No active issues P:   Monitor CBGs.  NEUROLOGIC A: No active issues P:   Avoid sedating medications given his hypotension.  TODAY'S SUMMARY: 81 y/o man with significant upper GI Bleed, remains hypotensive, hold in ICU.  The patient is critically ill with multiple organ systems failure and requires high complexity decision making for assessment and support, frequent evaluation and titration of  therapies, application of advanced monitoring technologies and extensive interpretation of multiple databases.   Critical Care Time devoted to patient care services described in this note is  35  Minutes. This time reflects time of care of this signee Dr Jennet Maduro. This critical care time does not reflect procedure time, or teaching time or supervisory time of PA/NP/Med student/Med Resident etc but could involve care discussion time.  Rush Farmer, M.D. [facility] Pulmonary/Critical Care Medicine. Pager: [phone]. After hours pager: [phone].  09/20/2014, 9:34 AM

## 2014-09-21 ENCOUNTER — Encounter (HOSPITAL_COMMUNITY): Payer: Self-pay | Admitting: Gastroenterology

## 2014-09-21 LAB — TYPE AND SCREEN
ABO/RH(D): A NEG
Antibody Screen: NEGATIVE
UNIT DIVISION: 0
UNIT DIVISION: 0
UNIT DIVISION: 0
UNIT DIVISION: 0
Unit division: 0
Unit division: 0
Unit division: 0
Unit division: 0

## 2014-09-21 LAB — CBC
HCT: 40.7 % (ref 39.0–52.0)
HEMATOCRIT: 40.4 % (ref 39.0–52.0)
HEMATOCRIT: 46.2 % (ref 39.0–52.0)
HEMOGLOBIN: 13.5 g/dL (ref 13.0–17.0)
Hemoglobin: 13.3 g/dL (ref 13.0–17.0)
Hemoglobin: 15.3 g/dL (ref 13.0–17.0)
MCH: 30 pg (ref 26.0–34.0)
MCH: 29.6 pg (ref 26.0–34.0)
MCH: 30.4 pg (ref 26.0–34.0)
MCHC: 33.4 g/dL (ref 30.0–36.0)
MCHC: 32.7 g/dL (ref 30.0–36.0)
MCHC: 33.1 g/dL (ref 30.0–36.0)
MCV: 89.8 fL (ref 78.0–100.0)
MCV: 90.6 fL (ref 78.0–100.0)
MCV: 91.8 fL (ref 78.0–100.0)
PLATELETS: 129 10*3/uL — AB (ref 150–400)
Platelets: 129 10*3/uL — ABNORMAL LOW (ref 150–400)
Platelets: 141 10*3/uL — ABNORMAL LOW (ref 150–400)
RBC: 4.5 MIL/uL (ref 4.22–5.81)
RBC: 4.49 MIL/uL (ref 4.22–5.81)
RBC: 5.03 MIL/uL (ref 4.22–5.81)
RDW: 17 % — ABNORMAL HIGH (ref 11.5–15.5)
RDW: 16.8 % — ABNORMAL HIGH (ref 11.5–15.5)
RDW: 16.7 % — AB (ref 11.5–15.5)
WBC: 6.1 10*3/uL (ref 4.0–10.5)
WBC: 6.4 10*3/uL (ref 4.0–10.5)
WBC: 8.4 10*3/uL (ref 4.0–10.5)

## 2014-09-21 LAB — H. PYLORI ANTIBODY, IGG: H Pylori IgG: <0.9 U/mL (ref 0.0–0.8)

## 2014-09-21 LAB — BASIC METABOLIC PANEL
ANION GAP: 7 (ref 5–15)
BUN: 55 mg/dL — ABNORMAL HIGH (ref 6–20)
CO2: 19 mmol/L — AB (ref 22–32)
CREATININE: 2.06 mg/dL — AB (ref 0.61–1.24)
Calcium: 8.1 mg/dL — ABNORMAL LOW (ref 8.9–10.3)
Chloride: 113 mmol/L — ABNORMAL HIGH (ref 101–111)
GFR calc Af Amer: 32 mL/min — ABNORMAL LOW (ref >60)
GFR calc non Af Amer: 28 mL/min — ABNORMAL LOW (ref >60)
Glucose, Bld: 82 mg/dL (ref 70–99)
Potassium: 5.6 mmol/L — ABNORMAL HIGH (ref 3.5–5.1)
Sodium: 139 mmol/L (ref 135–145)

## 2014-09-21 LAB — MAGNESIUM: MAGNESIUM: 1.8 mg/dL (ref 1.7–2.4)

## 2014-09-21 LAB — PHOSPHORUS: PHOSPHORUS: 3.4 mg/dL (ref 2.5–4.6)

## 2014-09-21 MED ORDER — PANTOPRAZOLE SODIUM 40 MG IV SOLR
40.0000 mg | Freq: Two times a day (BID) | INTRAVENOUS | Status: DC
  Administered 2014-09-21 – 2014-09-25 (×8): 40 mg via INTRAVENOUS
  Filled 2014-09-21 (×10): qty 40

## 2014-09-21 MED ORDER — ACETAMINOPHEN 325 MG PO TABS
650.0000 mg | ORAL_TABLET | Freq: Once | ORAL | Status: AC
  Administered 2014-09-21: 650 mg via ORAL
  Filled 2014-09-21: qty 2

## 2014-09-21 NOTE — Progress Notes (Signed)
MD made aware of K of 5.6.

## 2014-09-21 NOTE — Progress Notes (Signed)
Pt transferred to tele rm [id number] per MD order. Transferring RN called wife [name] to update her on new location. NAD VSS.

## 2014-09-21 NOTE — Care Management Note (Addendum)
Case Management Note  Patient Details  Name: Anthony Sparks MRN: [id number] Date of Birth: [birth year]  Subjective/Objective:        Adm w gi bleed            Action/Plan:lives w wife   Expected Discharge Date:                  Expected Discharge Plan:  [place]  In-House Referral:     Discharge planning Services     Post Acute Care Choice:    Choice offered to:     DME Arranged:    DME Agency:     HH Arranged:    [place] Agency:     Status of Service:     Medicare Important Message Given:    Date Medicare IM Given:    Medicare IM give by:    Date Additional Medicare IM Given:    Additional Medicare Important Message give by:     If discussed at [place] of Stay Meetings, dates discussed:    Additional Comments:ur ins review done.  Lacretia Leigh, RN 09/21/2014, 11:40 AM

## 2014-09-21 NOTE — Progress Notes (Signed)
[facility] PROGRESS NOTE Subjective No further gross bleeding. Hungry!  Objective: Vital signs in last 24 hours: Temp:  [97.3 F (36.3 C)-97.8 F (36.6 C)] 97.8 F (36.6 C) (05/02 0400) Pulse Rate:  [60-113] 93 (05/02 0700) Resp:  [12-21] 21 (05/02 0700) BP: (87-152)/(39-95) 152/72 mmHg (05/02 0700) SpO2:  [90 %-100 %] 99 % (05/02 0700) Weight:  [85 kg (187 lb 6.3 oz)] 85 kg (187 lb 6.3 oz) (05/02 0500) Last BM Date: 09/20/14  Intake/Output from previous day: 05/01 0701 - 05/02 0700 In: 3890 [P.O.:440; I.V.:3450] Out: 1550 [Urine:1550] Intake/Output this shift:    PE: General--NAD, tolerating clears  Abdomen--obese soft nontender.  Lab Results:  Recent Labs  09/20/14 1047 09/20/14 1530 09/20/14 1902 09/20/14 2307 09/21/14 0328  WBC 5.7 5.7 6.1 6.1 6.1  HGB 13.9 12.4* 12.6* 13.1 13.5  HCT 40.3 36.2* 37.6* 38.3* 40.4  PLT 107* 114* 119* 131* 129*   BMET  Recent Labs  09/19/14 1520 09/19/14 1544 09/19/14 1850 09/20/14 0407 09/21/14 0328  NA 140 139 137 142 139  K 5.4* 5.4* 5.5*  5.1 4.8 5.6*  CL 104 104 103 113* 113*  CO2 23  --  20 20* 19*  CREATININE 3.24* 3.00* 3.21* 2.73* 2.06*   LFT  Recent Labs  09/19/14 1520  PROT 5.4*  AST 18  ALT 10  ALKPHOS 47  BILITOT 0.8   PT/INR  Recent Labs  09/19/14 1520  LABPROT 16.1*  INR 1.28   PANCREAS No results for input(s): LIPASE in the last 72 hours.       Studies/Results: Dg Chest Port 1 View  09/19/2014   CLINICAL DATA:  Generalized weakness since last night, GI bleed, cough  EXAM: PORTABLE CHEST - 1 VIEW  COMPARISON:  10/17/2010  FINDINGS: Limited inspiratory effect. Mild cardiac enlargement. Vascular pattern normal. Right lung clear. Left lung essentially clear although retrocardiac portion of left lower lobe not well evaluated.  IMPRESSION: No acute findings except cannot evaluate retrocardiac left lower lobe on single AP view. Consider PA and lateral to characterize this  area better.   Electronically Signed   By: Skipper Cliche M.D.   On: 09/19/2014 15:48   Dg Abd Portable 1v  09/19/2014   CLINICAL DATA:  NG tube placement  EXAM: PORTABLE ABDOMEN - 1 VIEW  COMPARISON:  None.  FINDINGS: Enteric tube terminates in the gastric cardia.  None partum bowel gas pattern.  Multiple line/tubes/leads overly the patient.  IMPRESSION: Enteric tube terminates in the gastric cardia.   Electronically Signed   By: Julian Hy M.D.   On: 09/19/2014 18:12    Medications: I have reviewed the patient's current medications.  Assessment/Plan: 1. GI Bleed due to multiple ulcers. Probably NSAID induced appears stable. Would continue protonix drip until bag empty and then change to BID PO. Will advance to [facility] diet.   Kadrian Partch JR,Kennadee Walthour L 09/21/2014, 8:04 AM

## 2014-09-21 NOTE — Progress Notes (Signed)
PULMONARY / CRITICAL CARE MEDICINE HISTORY AND PHYSICAL EXAMINATION   Name: Anthony Sparks MRN: [id number] DOB: 15-Nov-1927    ADMISSION DATE:  09/19/2014  PRIMARY SERVICE: PCCM  CHIEF COMPLAINT:  GI Bleed  BRIEF PATIENT DESCRIPTION: 81 y/o man with lower GI bleed  SIGNIFICANT EVENTS / STUDIES:  EGD in ED which demonstrated ulcers in stomach and deuodenum, including stigmata of recent bleed  LINES / TUBES: 2 PIVs in Bilateral AC  CULTURES: None  ANTIBIOTICS: None  HISTORY OF PRESENT ILLNESS:   81 y/o man with a history of prostate CA, HTN, and renal insuffiency who presented to the ED with a 1 day history of black, tarry stools, and black vomit with abdominal pain. He denied NSAID use, and reported feeling well until recently. No EtOH or liver dz history. No cardiac history. He presented to the ED and was found to be hypotensive. He received 4U PRBCs and GI was called. An EGD was performed which demonstrated multiple gastric and duodenal uclers with stigmata of bleeding, but no active bleed. He was started on a PPI gtt and admitted to the [facility] service.  SUBJECTIVE:  Now normotensive protonix gtt still   VITAL SIGNS: Temp:  [97.3 F (36.3 C)-98.2 F (36.8 C)] 98.2 F (36.8 C) (05/02 0800) Pulse Rate:  [60-113] 96 (05/02 0800) Resp:  [13-21] 19 (05/02 0800) BP: (103-153)/(43-95) 153/77 mmHg (05/02 0800) SpO2:  [90 %-100 %] 96 % (05/02 0800) Weight:  [85 kg (187 lb 6.3 oz)] 85 kg (187 lb 6.3 oz) (05/02 0500) HEMODYNAMICS:   VENTILATOR SETTINGS:   INTAKE / OUTPUT: Intake/Output      05/01 0701 - 05/02 0700 05/02 0701 - 05/03 0700   P.O. 440 260   I.V. (mL/kg) 3450 (40.6) 25 (0.3)   Blood     IV Piggyback     Total Intake(mL/kg) 3890 (45.8) 285 (3.4)   Urine (mL/kg/hr) 1550 (0.8) 100 (0.5)   Total Output 1550 100   Net +2340 +185         PHYSICAL EXAMINATION (shortly after EGD, sedation): General:  Elderly man, awake and interactive. Neuro:  Awake and  interactive, follows all commands HEENT:  [place]/AT, PERRL, EOM-I and MMM, no further blood around the mouth. Neck: Supple, no JVD Cardiovascular: RRR, ultrasound demonstrates collapsble juglar vein, IVC. Lungs: CTA-B. Abdomen:  Mildly tender to deep palpation Musculoskeletal:  No deformities. Skin:  No rashes  LABS:  CBC  Recent Labs Lab 09/20/14 1902 09/20/14 2307 09/21/14 0328  WBC 6.1 6.1 6.1  HGB 12.6* 13.1 13.5  HCT 37.6* 38.3* 40.4  PLT 119* 131* 129*   Coag's  Recent Labs Lab 09/19/14 1520  APTT 41*  INR 1.28   BMET  Recent Labs Lab 09/19/14 1850 09/20/14 0407 09/21/14 0328  NA 137 142 139  K 5.5*  5.1 4.8 5.6*  CL 103 113* 113*  CO2 20 20* 19*  BUN 102* 80* 55*  CREATININE 3.21* 2.73* 2.06*  GLUCOSE 121* 74 82   Electrolytes  Recent Labs Lab 09/19/14 1850 09/20/14 0407 09/21/14 0328  CALCIUM 8.3* 7.8* 8.1*  MG 2.1  --  1.8  PHOS 4.2  --  3.4   Sepsis Markers No results for input(s): LATICACIDVEN, PROCALCITON, O2SATVEN in the last 168 hours. ABG No results for input(s): PHART, PCO2ART, PO2ART in the last 168 hours. Liver Enzymes  Recent Labs Lab 09/19/14 1520  AST 18  ALT 10  ALKPHOS 47  BILITOT 0.8  ALBUMIN 2.3*   Cardiac Enzymes No  results for input(s): TROPONINI, PROBNP in the last 168 hours. Glucose No results for input(s): GLUCAP in the last 168 hours.  Imaging Dg Chest Port 1 View  09/19/2014   CLINICAL DATA:  Generalized weakness since last night, GI bleed, cough  EXAM: PORTABLE CHEST - 1 VIEW  COMPARISON:  10/17/2010  FINDINGS: Limited inspiratory effect. Mild cardiac enlargement. Vascular pattern normal. Right lung clear. Left lung essentially clear although retrocardiac portion of left lower lobe not well evaluated.  IMPRESSION: No acute findings except cannot evaluate retrocardiac left lower lobe on single AP view. Consider PA and lateral to characterize this area better.   Electronically Signed   By: Skipper Cliche M.D.    On: 09/19/2014 15:48   Dg Abd Portable 1v  09/19/2014   CLINICAL DATA:  NG tube placement  EXAM: PORTABLE ABDOMEN - 1 VIEW  COMPARISON:  None.  FINDINGS: Enteric tube terminates in the gastric cardia.  None partum bowel gas pattern.  Multiple line/tubes/leads overly the patient.  IMPRESSION: Enteric tube terminates in the gastric cardia.   Electronically Signed   By: Julian Hy M.D.   On: 09/19/2014 18:12   EKG: Not assessed. CXR: enlarged cardiac [place] .  ASSESSMENT / PLAN:  Active Problems:   GI bleed   GI bleeding   PULMONARY A: Mild hypoxia P:   Titrate O2 for sat of 88-92%. Hold further IVF, consider diuresis next 24h   CARDIOVASCULAR A: Hypotension, hemorrhagic shock. resolved P:   Hold all anti-HTN. Restart 5/3 Watch closely for pulmonary edema.  RENAL A: AKI on CKD, improving P: Apparently has congenital component. Hydrate, trend Cr. BMET in AM. Replace electrolytes as indicated.  GASTROINTESTINAL A: Upper GI Bleed due to ulcers P:  Doing well on PPI gtt, transition now to q12h Trend CBC.  Hydrate to maintain good hemodynamic control. Hold further transfusion. Change CBC to q12 hours.  HEMATOLOGIC A: Anemia due to hemorrhage. P:   S/p 4 U PRBCs, transfuse to maintain Hb > 7.0  INFECTIOUS A: No active issues. P:   Monitor fever curve and WBC.  ENDOCRINE A: No active issues P:   Monitor CBGs.  NEUROLOGIC A: No active issues P:   Avoid sedating medications given his hypotension.  TODAY'S SUMMARY: 81 y/o man with significant upper GI Bleed, to floor bed   Baltazar Apo, MD, PhD 09/21/2014, 9:40 AM [facility] [phone] or if no answer [phone]

## 2014-09-21 NOTE — Progress Notes (Signed)
PT transferred to [place] from ICU, alert and oriented x4, vitals stable, reports received from [place], RN in regards to pt current medical status.  pt c/o of HA 9/10, MD notified and ordered received for one time dose of tylenol. Will continue to monitor pt for safety.

## 2014-09-22 DIAGNOSIS — N179 Acute kidney failure, unspecified: Secondary | ICD-10-CM

## 2014-09-22 LAB — BASIC METABOLIC PANEL
ANION GAP: 9 (ref 5–15)
Anion gap: 5 (ref 5–15)
BUN: 38 mg/dL — ABNORMAL HIGH (ref 6–20)
BUN: 32 mg/dL — AB (ref 6–20)
CO2: 18 mmol/L — AB (ref 22–32)
CO2: 22 mmol/L (ref 22–32)
CREATININE: 1.88 mg/dL — AB (ref 0.61–1.24)
Calcium: 8.7 mg/dL — ABNORMAL LOW (ref 8.9–10.3)
Calcium: 8.6 mg/dL — ABNORMAL LOW (ref 8.9–10.3)
Chloride: 112 mmol/L — ABNORMAL HIGH (ref 101–111)
Chloride: 108 mmol/L (ref 101–111)
Creatinine, Ser: 1.87 mg/dL — ABNORMAL HIGH (ref 0.61–1.24)
GFR calc Af Amer: 36 mL/min — ABNORMAL LOW (ref >60)
GFR calc Af Amer: 36 mL/min — ABNORMAL LOW (ref >60)
GFR calc non Af Amer: 31 mL/min — ABNORMAL LOW (ref >60)
GFR, EST NON AFRICAN AMERICAN: 31 mL/min — AB (ref >60)
GLUCOSE: 99 mg/dL (ref 70–99)
Glucose, Bld: 89 mg/dL (ref 70–99)
Potassium: 5.7 mmol/L — ABNORMAL HIGH (ref 3.5–5.1)
Potassium: 5.4 mmol/L — ABNORMAL HIGH (ref 3.5–5.1)
SODIUM: 139 mmol/L (ref 135–145)
Sodium: 135 mmol/L (ref 135–145)

## 2014-09-22 LAB — CBC
HEMATOCRIT: 44.3 % (ref 39.0–52.0)
HEMOGLOBIN: 14.4 g/dL (ref 13.0–17.0)
MCH: 30.1 pg (ref 26.0–34.0)
MCHC: 32.5 g/dL (ref 30.0–36.0)
MCV: 92.5 fL (ref 78.0–100.0)
PLATELETS: 123 10*3/uL — AB (ref 150–400)
RBC: 4.79 MIL/uL (ref 4.22–5.81)
RDW: 16.3 % — AB (ref 11.5–15.5)
WBC: 5.9 10*3/uL (ref 4.0–10.5)

## 2014-09-22 LAB — BLOOD PRODUCT ORDER (VERBAL) VERIFICATION

## 2014-09-22 MED ORDER — BENZONATATE 100 MG PO CAPS
100.0000 mg | ORAL_CAPSULE | Freq: Three times a day (TID) | ORAL | Status: DC | PRN
  Administered 2014-09-22 (×3): 100 mg via ORAL
  Filled 2014-09-22 (×6): qty 1

## 2014-09-22 MED ORDER — FUROSEMIDE 80 MG PO TABS
80.0000 mg | ORAL_TABLET | Freq: Every day | ORAL | Status: DC
  Administered 2014-09-22 – 2014-09-25 (×4): 80 mg via ORAL
  Filled 2014-09-22 (×5): qty 1

## 2014-09-22 MED ORDER — ACETAMINOPHEN 325 MG PO TABS
650.0000 mg | ORAL_TABLET | ORAL | Status: DC | PRN
  Administered 2014-09-22 – 2014-09-25 (×8): 650 mg via ORAL
  Filled 2014-09-22 (×8): qty 2

## 2014-09-22 NOTE — Progress Notes (Signed)
Patient had large black and tarry BM, and also patient has very course and crackled lungs. Sats are 100% on 2L/[place].  Patient has been coughing frequently. Notified on call for Critical Care.

## 2014-09-22 NOTE — Progress Notes (Signed)
eLink Physician-Brief Progress Note Patient Name: Anthony Sparks DOB: [birth year] MRN: [id number]   Date of Service  09/22/2014  HPI/Events of Note  Call from nurse reporting that patient had a large tarry stool.  Know GIB from ulcer.  Is HD stable.  CBC pending in AM.  Also concern for congested cough.  Sats are 100s on 2 L [place].    eICU Interventions  Plan: CBC in AM Tessalon Perles prn cough     Intervention Category Minor Interventions: Other:  Fiona Coto 09/22/2014, 12:08 AM

## 2014-09-22 NOTE — Progress Notes (Signed)
PULMONARY / CRITICAL CARE MEDICINE HISTORY AND PHYSICAL EXAMINATION   Name: Anthony Sparks MRN: [id number] DOB: 08/04/1927    ADMISSION DATE:  09/19/2014  PRIMARY SERVICE: PCCM  CHIEF COMPLAINT:  GI Bleed  BRIEF PATIENT DESCRIPTION: 81 y/o man with a history of prostate CA, HTN, and renal insuffiency who presented to the ED with a 1 day history of black, tarry stools, and black vomit with abdominal pain. He denied NSAID use, and reported feeling well until recently. No EtOH or liver dz history. No cardiac history. He presented to the ED and was found to be hypotensive. He received 4U PRBCs and GI was called. An EGD was performed which demonstrated multiple gastric and duodenal uclers with stigmata of bleeding, but no active bleed. He was started on a PPI gtt and admitted to the [facility] service.  SIGNIFICANT EVENTS / STUDIES:  4/30 EGD in ED which demonstrated ulcers in stomach and deuodenum, including stigmata of recent bleed 5/2 out of ICU 5/3 another large tarry stool, HGB stable  SUBJECTIVE:  Large dark tarry stool overnight.  VITAL SIGNS: Temp:  [97.4 F (36.3 C)-98 F (36.7 C)] 97.8 F (36.6 C) (05/03 0404) Pulse Rate:  [64-112] 112 (05/03 0404) Resp:  [18-21] 21 (05/03 0404) BP: (110-161)/(69-82) 110/82 mmHg (05/03 0404) SpO2:  [97 %-100 %] 97 % (05/03 0404) Weight:  [83.5 kg (184 lb 1.4 oz)] 83.5 kg (184 lb 1.4 oz) (05/03 0404) HEMODYNAMICS:   VENTILATOR SETTINGS:   INTAKE / OUTPUT: Intake/Output      05/02 0701 - 05/03 0700 05/03 0701 - 05/04 0700   P.O. 620 240   I.V. (mL/kg) 125 (1.5)    Total Intake(mL/kg) 745 (8.9) 240 (2.9)   Urine (mL/kg/hr) 1110 (0.6)    Total Output 1110     Net -365 +240         PHYSICAL EXAMINATION (shortly after EGD, sedation): General:  Elderly man, awake and interactive. Neuro:  Awake and interactive, follows all commands HEENT:  [place]/AT, PERRL, EOM-I and MMM, no further blood around the mouth. Neck: Supple, no JVD Cardiovascular:  RRR, ultrasound demonstrates collapsble juglar vein, IVC. Lungs: CTA-B. Abdomen:  Mildly tender to deep palpation Musculoskeletal:  No deformities. Skin:  No rashes  LABS:  CBC  Recent Labs Lab 09/21/14 0949 09/21/14 1805 09/22/14 0524  WBC 6.4 8.4 5.9  HGB 13.3 15.3 14.4  HCT 40.7 46.2 44.3  PLT 129* 141* 123*   Coag's  Recent Labs Lab 09/19/14 1520  APTT 41*  INR 1.28   BMET  Recent Labs Lab 09/20/14 0407 09/21/14 0328 09/22/14 0524  NA 142 139 139  K 4.8 5.6* 5.7*  CL 113* 113* 112*  CO2 20* 19* 18*  BUN 80* 55* 38*  CREATININE 2.73* 2.06* 1.87*  GLUCOSE 74 82 89   Electrolytes  Recent Labs Lab 09/19/14 1850 09/20/14 0407 09/21/14 0328 09/22/14 0524  CALCIUM 8.3* 7.8* 8.1* 8.7*  MG 2.1  --  1.8  --   PHOS 4.2  --  3.4  --    Sepsis Markers No results for input(s): LATICACIDVEN, PROCALCITON, O2SATVEN in the last 168 hours. ABG No results for input(s): PHART, PCO2ART, PO2ART in the last 168 hours. Liver Enzymes  Recent Labs Lab 09/19/14 1520  AST 18  ALT 10  ALKPHOS 47  BILITOT 0.8  ALBUMIN 2.3*    Imaging No results found.  ASSESSMENT / PLAN:   Hemorrhagic shock 2nd to GI bleeding. Resolved Upper GI Bleed due to ulcers Acute blood  loss anemia - Restart home lasix 5/3, continue to hold nifedipine - Watch closely for pulmonary edema. - Protonix BID - Trend CBC - Transfuse for HGB less than 7 - SCDs - Advance diet to hearth healthy  AKI on CKD, improving Hyperkalemia - Apparently has congenital component.  - Hydrate - Correct K (5.7 - will restart home lasix 80mg  today 5/3 and re-assess ) - EKG - Check Bmet this PM  GLOBAL: - PT eval today  Suspect can DC next 24-48 hours  Georgann Housekeeper, AGACNP-BC [facility] Pulmonology/Critical Care Pager [phone] or [phone]  09/22/2014 11:19 AM

## 2014-09-22 NOTE — Progress Notes (Signed)
[facility] PROGRESS NOTE Subjective Pt had BM that was dark but Hg stable.  Objective: Vital signs in last 24 hours: Temp:  [97.4 F (36.3 C)-97.8 F (36.6 C)] 97.8 F (36.6 C) (05/03 0404) Pulse Rate:  [64-112] 112 (05/03 0404) Resp:  [18-21] 21 (05/03 0404) BP: (110-155)/(69-82) 110/82 mmHg (05/03 0404) SpO2:  [97 %-100 %] 97 % (05/03 0404) Weight:  [83.5 kg (184 lb 1.4 oz)] 83.5 kg (184 lb 1.4 oz) (05/03 0404) Last BM Date: 09/22/14  Intake/Output from previous day: 05/02 0701 - 05/03 0700 In: 745 [P.O.:620; I.V.:125] Out: 1110 [Urine:1110] Intake/Output this shift: Total I/O In: 480 [P.O.:480] Out: 250 [Urine:250]  PE: General--NAD  Abdomen--soft nontender  Lab Results:  Recent Labs  09/20/14 2307 09/21/14 0328 09/21/14 0949 09/21/14 1805 09/22/14 0524  WBC 6.1 6.1 6.4 8.4 5.9  HGB 13.1 13.5 13.3 15.3 14.4  HCT 38.3* 40.4 40.7 46.2 44.3  PLT 131* 129* 129* 141* 123*   BMET  Recent Labs  09/19/14 1520 09/19/14 1544 09/19/14 1850 09/20/14 0407 09/21/14 0328 09/22/14 0524  NA 140 139 137 142 139 139  K 5.4* 5.4* 5.5*  5.1 4.8 5.6* 5.7*  CL 104 104 103 113* 113* 112*  CO2 23  --  20 20* 19* 18*  CREATININE 3.24* 3.00* 3.21* 2.73* 2.06* 1.87*   LFT  Recent Labs  09/19/14 1520  PROT 5.4*  AST 18  ALT 10  ALKPHOS 47  BILITOT 0.8   PT/INR  Recent Labs  09/19/14 1520  LABPROT 16.1*  INR 1.28   PANCREAS No results for input(s): LIPASE in the last 72 hours.       Studies/Results: No results found.  Medications: I have reviewed the patient's current medications.  Assessment/Plan: 1. UGI Bleed. Due to multiple ulcers probably related to NSAIDS.Marland Kitchen Should be ok to advance diet and discharge home on ppi therapy.  Needs to see Dr Michail Sermon in office in about 3-4 weeks to arrange repeat EGD.   Khara Renaud JR,Tyneka Scafidi L 09/22/2014, 2:15 PM  Pager: [phone] If no answer or after hours call [phone]

## 2014-09-23 ENCOUNTER — Inpatient Hospital Stay (HOSPITAL_COMMUNITY): Payer: Medicare HMO

## 2014-09-23 DIAGNOSIS — K264 Chronic or unspecified duodenal ulcer with hemorrhage: Secondary | ICD-10-CM

## 2014-09-23 DIAGNOSIS — R5381 Other malaise: Secondary | ICD-10-CM

## 2014-09-23 LAB — BASIC METABOLIC PANEL
Anion gap: 8 (ref 5–15)
BUN: 31 mg/dL — AB (ref 6–20)
CALCIUM: 8.7 mg/dL — AB (ref 8.9–10.3)
CHLORIDE: 105 mmol/L (ref 101–111)
CO2: 23 mmol/L (ref 22–32)
Creatinine, Ser: 1.94 mg/dL — ABNORMAL HIGH (ref 0.61–1.24)
GFR calc Af Amer: 35 mL/min — ABNORMAL LOW (ref >60)
GFR calc non Af Amer: 30 mL/min — ABNORMAL LOW (ref >60)
Glucose, Bld: 94 mg/dL (ref 70–99)
POTASSIUM: 5.3 mmol/L — AB (ref 3.5–5.1)
SODIUM: 136 mmol/L (ref 135–145)

## 2014-09-23 LAB — CBC
HCT: 44.1 % (ref 39.0–52.0)
HEMOGLOBIN: 14.3 g/dL (ref 13.0–17.0)
MCH: 30.2 pg (ref 26.0–34.0)
MCHC: 32.4 g/dL (ref 30.0–36.0)
MCV: 93 fL (ref 78.0–100.0)
Platelets: 154 10*3/uL (ref 150–400)
RBC: 4.74 MIL/uL (ref 4.22–5.81)
RDW: 16.1 % — ABNORMAL HIGH (ref 11.5–15.5)
WBC: 5.2 10*3/uL (ref 4.0–10.5)

## 2014-09-23 MED ORDER — ALLOPURINOL 300 MG PO TABS
300.0000 mg | ORAL_TABLET | Freq: Every day | ORAL | Status: DC
  Administered 2014-09-23 – 2014-09-25 (×3): 300 mg via ORAL
  Filled 2014-09-23 (×3): qty 1

## 2014-09-23 MED ORDER — NIFEDIPINE ER 30 MG PO TB24
30.0000 mg | ORAL_TABLET | Freq: Every day | ORAL | Status: DC
  Administered 2014-09-23 – 2014-09-25 (×3): 30 mg via ORAL
  Filled 2014-09-23 (×3): qty 1

## 2014-09-23 NOTE — Evaluation (Signed)
Physical Therapy Evaluation Patient Details Name: Anthony Sparks MRN: [id number] DOB: [birth year] Today's Date: 09/23/2014   History of Present Illness  Pt is an 81 y/o male admitted with weakness and vomiting. Pt was found to have a lower GI bleed.  Clinical Impression  Pt admitted with above diagnosis. Pt currently with functional limitations due to the deficits listed below (see PT Problem List). At the time of PT eval pt was able to perform transfers with mod assistance for support, balance, and safety. Per wife, pt does not participate in much physical activity at home however he was able to negotiate around his home with modified independence prior to this functional decline. Pt will benefit from skilled PT to increase their independence and safety with mobility to allow discharge to the venue listed below.  Discussed the need for continued therapy at d/c and pt/wife agreeable. They wish to look into [facility] but are concerned with insurance coverage.     Follow Up Recommendations SNF;Supervision/Assistance - 24 hour    Equipment Recommendations  None recommended by PT    Recommendations for Other Services       Precautions / Restrictions Precautions Precautions: Fall Restrictions Weight Bearing Restrictions: No      Mobility  Bed Mobility Overal bed mobility: Needs Assistance Bed Mobility: Supine to Sit     Supine to sit: Min assist     General bed mobility comments: Pt required assist to elevate trunk into full sitting position at EOB. Heavy use of bed rails required.   Transfers Overall transfer level: Needs assistance Equipment used: Standard walker Transfers: Sit to/from [employer] Transfers Sit to Stand: Mod assist Stand pivot transfers: Min guard       General transfer comment: Pt required mod assist to power-up to full standing position at EOB. VC's for hand placement on seated surface and for anterior lean to get weight over BOS. Once standing  pt was able to take pivotal steps only around to the recliner chair. Increased time needed.   Ambulation/Gait             General Gait Details: Pt was not able to tolerate any further mobility at this time due to weakness.  Stairs            Wheelchair Mobility    Modified Rankin (Stroke Patients Only)       Balance Overall balance assessment: Needs assistance Sitting-balance support: Feet supported Sitting balance-Leahy Scale: Fair     Standing balance support: Bilateral upper extremity supported Standing balance-Leahy Scale: Poor                               Pertinent Vitals/Pain Pain Assessment: Faces Faces Pain Scale: Hurts a little bit Pain Location: Pt grimacing with transfers and MMT. States it is just a little painful all over Pain Descriptors / Indicators: Grimacing;Discomfort Pain Intervention(s): Limited activity within patient's tolerance;Monitored during session;Repositioned    Home Living Family/patient expects to be discharged to:: Private residence Living Arrangements: Spouse/significant other Available Help at Discharge: Family;Available 24 hours/day Type of Home: House Home Access: Stairs to enter Entrance Stairs-Rails: Psychiatric nurse of Steps: 4 Home Layout: One level Home Equipment: Walker - standard;Shower seat      Prior Function Level of Independence: Independent with assistive device(s)         Comments: Wife is available for occasional assistance     Hand Dominance   Dominant Hand: Right  Extremity/Trunk Assessment   Upper Extremity Assessment: Generalized weakness           Lower Extremity Assessment: Generalized weakness      Cervical / Trunk Assessment: Kyphotic  Communication   Communication: HOH  Cognition Arousal/Alertness: Awake/alert Behavior During Therapy: WFL for tasks assessed/performed Overall Cognitive Status: Within Functional Limits for tasks assessed                       General Comments      Exercises        Assessment/Plan    PT Assessment Patient needs continued PT services  PT Diagnosis Difficulty walking;Generalized weakness   PT Problem List Decreased strength;Decreased range of motion;Decreased activity tolerance;Decreased balance;Decreased mobility;Decreased knowledge of use of DME;Decreased safety awareness;Decreased knowledge of precautions  PT Treatment Interventions DME instruction;Gait training;Stair training;Functional mobility training;Therapeutic activities;Therapeutic exercise;Neuromuscular re-education;Patient/family education   PT Goals (Current goals can be found in the Care Plan section) Acute Rehab PT Goals Patient Stated Goal: Be bale to walk better PT Goal Formulation: With patient/family Time For Goal Achievement: 09/30/14 Potential to Achieve Goals: Good    Frequency Min 2X/week   Barriers to discharge        Co-evaluation               End of Session Equipment Utilized During Treatment: Gait belt Activity Tolerance: Patient tolerated treatment well Patient left: in chair;with call bell/phone within reach;with family/visitor present Nurse Communication: Mobility status         Time: 6213-0865 PT Time Calculation (min) (ACUTE ONLY): 25 min   Charges:   PT Evaluation $Initial PT Evaluation Tier I: 1 Procedure PT Treatments $Therapeutic Activity: 8-22 mins   PT G Codes:        Rolinda Roan Sep 26, 2014, 12:13 PM   Rolinda Roan, PT, DPT Acute Rehabilitation Services Pager: [phone]

## 2014-09-23 NOTE — Progress Notes (Signed)
09/23/2014 8:53 PM   Patient only able to get up to beside commode via front wheel walker. Patient complains of being weak and short of breath when getting up to use the [facility]. Patient unable to walk past doorway. Patient resting in bed and call bell in reach. Will continue to monitor patient.   Ermalinda Memos, RN  [facility] Phone [phone]

## 2014-09-23 NOTE — Progress Notes (Signed)
PULMONARY / CRITICAL CARE MEDICINE HISTORY AND PHYSICAL EXAMINATION   Name: Anthony Sparks MRN: [id number] DOB: Sep 10, 1927    ADMISSION DATE:  09/19/2014  PRIMARY SERVICE: PCCM  CHIEF COMPLAINT:  GI Bleed  BRIEF PATIENT DESCRIPTION: 81 y/o man with a history of prostate CA, HTN, and renal insuffiency who presented to the ED with a 1 day history of black, tarry stools, and black vomit with abdominal pain. He denied NSAID use, and reported feeling well until recently. No EtOH or liver dz history. No cardiac history. He presented to the ED and was found to be hypotensive. He received 4U PRBCs and GI was called. An EGD was performed which demonstrated multiple gastric and duodenal uclers with stigmata of bleeding, but no active bleed. He was started on a PPI gtt and admitted to the [facility] service.  SIGNIFICANT EVENTS / STUDIES:  4/30 EGD in ED which demonstrated ulcers in stomach and deuodenum, including stigmata of recent bleed 5/2 out of ICU 5/3 large tarry stool, HGB stable  SUBJECTIVE:  No acute events overnight, feels weak, definitely too weak to discharge  VITAL SIGNS: Temp:  [97.5 F (36.4 C)-98.2 F (36.8 C)] 98.2 F (36.8 C) (05/04 0344) Pulse Rate:  [85-105] 86 (05/04 0344) Resp:  [18-20] 18 (05/04 0344) BP: (130-141)/(66-75) 130/66 mmHg (05/04 0344) SpO2:  [96 %-97 %] 96 % (05/04 0344) HEMODYNAMICS:   VENTILATOR SETTINGS:   INTAKE / OUTPUT: Intake/Output      05/03 0701 - 05/04 0700 05/04 0701 - 05/05 0700   P.O. 780 240   I.V. (mL/kg)     Total Intake(mL/kg) 780 (9.3) 240 (2.9)   Urine (mL/kg/hr) 1350 (0.7)    Total Output 1350     Net -570 +240         PHYSICAL EXAMINATION (shortly after EGD, sedation): General:  Elderly man, awake and interactive. Neuro:  Awake and interactive, follows all commands HEENT:  [place]/AT, PERRL, No JVD Cardiovascular: RRR, no MRG Lungs: coarse wheeze bilaterally and cough Abdomen:  Mildly tender to deep palpation Musculoskeletal:   No deformities. Skin:  No rashes  LABS:  CBC  Recent Labs Lab 09/21/14 1805 09/22/14 0524 09/23/14 0325  WBC 8.4 5.9 5.2  HGB 15.3 14.4 14.3  HCT 46.2 44.3 44.1  PLT 141* 123* 154   Coag's  Recent Labs Lab 09/19/14 1520  APTT 41*  INR 1.28   BMET  Recent Labs Lab 09/22/14 0524 09/22/14 1833 09/23/14 0325  NA 139 135 136  K 5.7* 5.4* 5.3*  CL 112* 108 105  CO2 18* 22 23  BUN 38* 32* 31*  CREATININE 1.87* 1.88* 1.94*  GLUCOSE 89 99 94   Electrolytes  Recent Labs Lab 09/19/14 1850  09/21/14 0328 09/22/14 0524 09/22/14 1833 09/23/14 0325  CALCIUM 8.3*  < > 8.1* 8.7* 8.6* 8.7*  MG 2.1  --  1.8  --   --   --   PHOS 4.2  --  3.4  --   --   --   < > = values in this interval not displayed. Sepsis Markers No results for input(s): LATICACIDVEN, PROCALCITON, O2SATVEN in the last 168 hours. ABG No results for input(s): PHART, PCO2ART, PO2ART in the last 168 hours. Liver Enzymes  Recent Labs Lab 09/19/14 1520  AST 18  ALT 10  ALKPHOS 47  BILITOT 0.8  ALBUMIN 2.3*    Imaging No results found.  ASSESSMENT / PLAN:  Hemorrhagic shock 2nd to GI bleeding. Resolved Upper GI Bleed due to  ulcers Acute blood loss anemia - Resume home lasix, nifedipine, allopurinol - Protonix BID - Trend CBC - Transfuse for HGB less than 7 - SCD's - Heart healthy diet  Cough - Check CXR  AKI on CKD, improving Hyperkalemia > improving - Apparently has congenital component.  - K improving with home lasix dose - EKG - Check Bmet this PM  GLOBAL: - PT has seen patient, official note pending. Recommending SNF placement for rehab. Social work aware.  - Will have RN ambulate patient  Suspect can DC next 24 hours  Georgann Housekeeper, AGACNP-BC [facility] Pulmonology/Critical Care Pager [phone] or [phone]  09/23/2014 10:48 AM

## 2014-09-24 LAB — BASIC METABOLIC PANEL
Anion gap: 8 (ref 5–15)
BUN: 26 mg/dL — AB (ref 6–20)
CALCIUM: 8.9 mg/dL (ref 8.9–10.3)
CO2: 28 mmol/L (ref 22–32)
Chloride: 100 mmol/L — ABNORMAL LOW (ref 101–111)
Creatinine, Ser: 2.05 mg/dL — ABNORMAL HIGH (ref 0.61–1.24)
GFR calc Af Amer: 33 mL/min — ABNORMAL LOW (ref >60)
GFR calc non Af Amer: 28 mL/min — ABNORMAL LOW (ref >60)
GLUCOSE: 91 mg/dL (ref 70–99)
Potassium: 4.7 mmol/L (ref 3.5–5.1)
Sodium: 136 mmol/L (ref 135–145)

## 2014-09-24 LAB — CBC
HEMATOCRIT: 43.1 % (ref 39.0–52.0)
HEMOGLOBIN: 14.3 g/dL (ref 13.0–17.0)
MCH: 30.7 pg (ref 26.0–34.0)
MCHC: 33.2 g/dL (ref 30.0–36.0)
MCV: 92.5 fL (ref 78.0–100.0)
Platelets: 175 10*3/uL (ref 150–400)
RBC: 4.66 MIL/uL (ref 4.22–5.81)
RDW: 15.5 % (ref 11.5–15.5)
WBC: 5.4 10*3/uL (ref 4.0–10.5)

## 2014-09-24 NOTE — Clinical Social Work Note (Signed)
Clinical Social Work Assessment  Patient Details  Name: Anthony Sparks MRN: [id number] Date of Birth: 12-29-1927  Date of referral:  09/24/14               Reason for consult:  Facility Placement                Permission sought to share information with:  Family Supports Permission granted to share information::  Yes, Verbal Permission Granted  Name::     Wife, Social research officer, government::     Relationship::     Contact Information:     Housing/Transportation Living arrangements for the past 2 months:  Single Family Home Source of Information:  Spouse Patient Interpreter Needed:  None Criminal Activity/Legal Involvement Pertinent to Current Situation/Hospitalization:  No - Comment as needed Significant Relationships:  Adult Children, Spouse Lives with:  Spouse Do you feel safe going back to the place where you live?  Yes Need for family participation in patient care:  No (Coment)  Care giving concerns:  Per wife, patient needs rehab before coming home.   Social Worker assessment / plan:  CSW talked with patient and his wife, Anthony Sparks. Anthony Sparks is alert and oriented and interacted appropriately with CSW during conversation. Patient allowed wife to take the lead in conversation with CSW regarding d/c plans. Wife interested in [facility] is their 2nd choice. [name] informed CSW that a family member (granddaughter-in-law) works there.  Employment status:  Retired Nurse, adult PT Recommendations:  [place] / Referral to community resources:     Patient/Family's Response to care:  Patient/wife in agreement with ST rehab and provided CSW with preferences.  Patient/Family's Understanding of and Emotional Response to Diagnosis, Current Treatment, and Prognosis:  Not discussed.  Emotional Assessment Appearance:  Appears stated age Attitude/Demeanor/Rapport:  Other (Appropriate for  interaction) Affect (typically observed):  Appropriate Orientation:  Oriented to Self, Oriented to Place, Oriented to  Time, Oriented to Situation Alcohol / Substance use:  Tobacco Use (Patient quit smoking. He does not use alcohol or illicit drugs.) Psych involvement (Current and /or in the community):  No (Comment)  Discharge Needs  Concerns to be addressed:  Discharge Planning Concerns (Patient needs ST rehab) Readmission within the last 30 days:  Yes Current discharge risk:  None Barriers to Discharge:  No Barriers Identified   Anthony Feil, LCSW 09/24/2014, 3:39 PM

## 2014-09-24 NOTE — Progress Notes (Signed)
PULMONARY / CRITICAL CARE MEDICINE HISTORY AND PHYSICAL EXAMINATION   Name: Anthony Sparks MRN: [id number] DOB: 1927/07/07    ADMISSION DATE:  09/19/2014  PRIMARY SERVICE: PCCM  CHIEF COMPLAINT:  GI Bleed  BRIEF PATIENT DESCRIPTION: 81 y/o man with a history of prostate CA, HTN, and renal insuffiency who presented to the ED with a 1 day history of black, tarry stools, and black vomit with abdominal pain. He denied NSAID use, and reported feeling well until recently. No EtOH or liver dz history. No cardiac history. He presented to the ED and was found to be hypotensive. He received 4U PRBCs and GI was called. An EGD was performed which demonstrated multiple gastric and duodenal uclers with stigmata of bleeding, but no active bleed. He was started on a PPI gtt and admitted to the [facility] service.  SIGNIFICANT EVENTS / STUDIES:  4/30 EGD in ED which demonstrated ulcers in stomach and deuodenum, including stigmata of recent bleed 5/2 out of ICU 5/3 large tarry stool, HGB stable  SUBJECTIVE:  No acute events overnight, NSC, needs SNF  VITAL SIGNS: Temp:  [97.9 F (36.6 C)-98.7 F (37.1 C)] 97.9 F (36.6 C) (05/05 0457) Pulse Rate:  [83-109] 83 (05/05 0457) Resp:  [18-20] 18 (05/05 0457) BP: (117-134)/(58-73) 134/72 mmHg (05/05 0457) SpO2:  [93 %-94 %] 93 % (05/05 0457) Weight:  [173 lb 8 oz (78.7 kg)] 173 lb 8 oz (78.7 kg) (05/05 0243) HEMODYNAMICS:   VENTILATOR SETTINGS:   INTAKE / OUTPUT: Intake/Output      05/04 0701 - 05/05 0700 05/05 0701 - 05/06 0700   P.O. 720    Total Intake(mL/kg) 720 (9.1)    Urine (mL/kg/hr) 4300 (2.3)    Stool 0 (0)    Total Output 4300     Net -3580          Urine Occurrence 1 x    Stool Occurrence 2 x     PHYSICAL EXAMINATION: General:  Elderly man, awake and interactive. Feels no better Neuro:  Awake and interactive, follows all commands, dull affect HEENT:  [place]/AT, PERRL, No JVD Cardiovascular: RRR, no MRG Lungs: coarse wheeze bilaterally  and congested cough . Shallow respirations Abdomen:  Mildly tender to deep palpation, inct stool and bladder Musculoskeletal:  No deformities. Skin:  No rashes. Multiple areas of ecchymosis   LABS:  CBC  Recent Labs Lab 09/22/14 0524 09/23/14 0325 09/24/14 0505  WBC 5.9 5.2 5.4  HGB 14.4 14.3 14.3  HCT 44.3 44.1 43.1  PLT 123* 154 175   Coag's  Recent Labs Lab 09/19/14 1520  APTT 41*  INR 1.28   BMET  Recent Labs Lab 09/22/14 1833 09/23/14 0325 09/24/14 0505  NA 135 136 136  K 5.4* 5.3* 4.7  CL 108 105 100*  CO2 22 23 28   BUN 32* 31* 26*  CREATININE 1.88* 1.94* 2.05*  GLUCOSE 99 94 91   Electrolytes  Recent Labs Lab 09/19/14 1850  09/21/14 0328  09/22/14 1833 09/23/14 0325 09/24/14 0505  CALCIUM 8.3*  < > 8.1*  < > 8.6* 8.7* 8.9  MG 2.1  --  1.8  --   --   --   --   PHOS 4.2  --  3.4  --   --   --   --   < > = values in this interval not displayed. Sepsis Markers No results for input(s): LATICACIDVEN, PROCALCITON, O2SATVEN in the last 168 hours. ABG No results for input(s): PHART, PCO2ART, PO2ART in the  last 168 hours. Liver Enzymes  Recent Labs Lab 09/19/14 1520  AST 18  ALT 10  ALKPHOS 47  BILITOT 0.8  ALBUMIN 2.3*    Imaging Dg Chest 2 View  09/23/2014   CLINICAL DATA:  Coughing, wheezing, history of prostate cancer and hypertension  EXAM: CHEST  2 VIEW  COMPARISON:  09/19/2014  FINDINGS: Stable mild cardiac enlargement. Vascular pattern normal. Right lung is clear. There is left lower lobe opacity representing small pleural effusion and underlying airspace disease.  IMPRESSION: Left lower lobe consolidation and small effusion.   Electronically Signed   By: Skipper Cliche M.D.   On: 09/23/2014 11:47    ASSESSMENT / PLAN:  Hemorrhagic shock 2nd to GI bleeding. 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  - K improving with home lasix dose - EKG - follow Bmet   GLOBAL: - PT has seen patient, official note pending. Recommending SNF placement for rehab. Social work aware.  - Wrote order for social worker 5/5    Richardson Landry Francis Doenges ACNP Maryanna Shape PCCM Pager [phone] till 3 pm If no answer page [phone] 09/24/2014, 11:35 AM

## 2014-09-25 LAB — BASIC METABOLIC PANEL
ANION GAP: 9 (ref 5–15)
BUN: 30 mg/dL — ABNORMAL HIGH (ref 6–20)
CALCIUM: 9 mg/dL (ref 8.9–10.3)
CO2: 28 mmol/L (ref 22–32)
CREATININE: 2.25 mg/dL — AB (ref 0.61–1.24)
Chloride: 102 mmol/L (ref 101–111)
GFR calc Af Amer: 29 mL/min — ABNORMAL LOW (ref >60)
GFR calc non Af Amer: 25 mL/min — ABNORMAL LOW (ref >60)
Glucose, Bld: 96 mg/dL (ref 70–99)
Potassium: 4.5 mmol/L (ref 3.5–5.1)
SODIUM: 139 mmol/L (ref 135–145)

## 2014-09-25 MED ORDER — ACETAMINOPHEN 325 MG PO TABS: 650.0000 mg | ORAL_TABLET | ORAL | Status: DC | PRN

## 2014-09-25 NOTE — Discharge Summary (Signed)
Physician Discharge Summary  Patient ID: Anthony Sparks MRN: [id number] DOB/AGE: 10-24-1927 81 y.o.  Admit date: 09/19/2014 Discharge date: 09/25/2014  Problem List Active Problems:   GI bleed   GI bleeding  HPI: 81 y/o man with a history of prostate CA, HTN, and renal insuffiency who presented to the ED with a 1 day history of black, tarry stools, and black vomit with abdominal pain. He denied NSAID use, and reported feeling well until recently. No EtOH or liver dz history. No cardiac history. He presented to the ED and was found to be hypotensive. He received 4U PRBCs and GI was called. An EGD was performed which demonstrated multiple gastric and duodenal uclers with stigmata of bleeding, but no active bleed. He was started on a PPI gtt and admitted to the [facility] service.  Hospital Course:  He was admitted to the intensive care unit where he had an endoscopy on 4/30 which demonstrated ulcers in the stomach and duodenum which were felt to be due to NSAID use.  His wife reported that he had been taking Aleve at home.  He was transferred out of the ICU on 5/2 and his Hgb remained stable.  He was managed on lasix on the floor which was associated with a mild rise in his Cr.  We held lasix at discharge with plans to resume at a lower dose 3 days after hospital discharge.  See detailed plan by   SIGNIFICANT EVENTS / STUDIES:  4/30 EGD in ED which demonstrated ulcers in stomach and deuodenum, including stigmata of recent bleed 5/2 out of ICU 5/3 large tarry stool, HGB stable 5/6 transfer to SNF.   ASSESSMENT / PLAN:  Hemorrhagic shock 2nd to GI bleeding. Resolved Upper GI Bleed due to ulcers Acute blood loss anemia Hypertension - continue home nifedipine, allopurinol - continue Protonix BID until follow up with [facility] (Dr. 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  - K improving with home lasix dose but creatine is rising. We hold lasix 5/7 thru 5/8 and check bmet if creatine improved then resume lasix at 40 mg daily(half dose) - EKG - follow Bmet         Labs at discharge Lab Results  Component Value Date   CREATININE 2.25* 09/25/2014   BUN 30* 09/25/2014   NA 139 09/25/2014   K 4.5 09/25/2014   CL 102 09/25/2014   CO2 28 09/25/2014   Lab Results  Component Value Date   WBC 5.4 09/24/2014   HGB 14.3 09/24/2014   HCT 43.1 09/24/2014   MCV 92.5 09/24/2014   PLT 175 09/24/2014   Lab Results  Component Value Date   ALT 10 09/19/2014   AST 18 09/19/2014   ALKPHOS 47 09/19/2014   BILITOT 0.8 09/19/2014   Lab Results  Component Value Date   INR 1.28 09/19/2014    Current radiology studies Dg Chest 2 View  09/23/2014   CLINICAL DATA:  Coughing,  wheezing, history of prostate cancer and hypertension  EXAM: CHEST  2 VIEW  COMPARISON:  09/19/2014  FINDINGS: Stable mild cardiac enlargement. Vascular pattern normal. Right lung is clear. There is left lower lobe opacity representing small pleural effusion and underlying airspace disease.  IMPRESSION: Left lower lobe consolidation and small effusion.   Electronically Signed   By: Skipper Cliche M.D.   On: 09/23/2014 11:47    Disposition:  03-Skilled Nursing Facility  Discharge Instructions    Discharge to SNF when bed available    Complete by:  As directed             Medication List    TAKE these medications        acetaminophen 325 MG tablet  Commonly known as:  TYLENOL  Take 2 tablets (650 mg total) by mouth every 4 (four) hours as needed for fever or mild  pain.     allopurinol 300 MG tablet  Commonly known as:  ZYLOPRIM  Take 300 mg by mouth daily.     furosemide 80 MG tablet  Commonly known as:  LASIX  Take 80 mg by mouth daily.     ibuprofen 200 MG tablet  Commonly known as:  ADVIL,MOTRIN  Take 200 mg by mouth every 6 (six) hours as needed for mild pain or moderate pain.     NIFEdipine 30 MG 24 hr tablet  Commonly known as:  PROCARDIA-XL/ADALAT-CC/NIFEDICAL-XL  Take 30 mg by mouth daily.          Discharged Condition: fair  Time spent on discharge greater than 40 minutes.  Vital signs at Discharge. Temp:  [97.7 F (36.5 C)-98.2 F (36.8 C)] 97.7 F (36.5 C) (05/06 1000) Pulse Rate:  [74-78] 78 (05/06 1000) Resp:  [20-21] 21 (05/06 1000) BP: (101-121)/(56-61) 107/56 mmHg (05/06 1000) SpO2:  [93 %-96 %] 96 % (05/06 1000) Weight:  [177 lb 7.5 oz (80.5 kg)] 177 lb 7.5 oz (80.5 kg) (05/06 0426) Office follow up Special Information or instructions.  He will be followed by SNF MD post discharge. We will hold lasix x 2 days and resume day 3(5/9) if creatine better by BMET. Needs to follow up with [facility]  Signed: Richardson Landry Minor ACNP Maryanna Shape PCCM Pager [phone] till 3 pm If no answer page [phone] 09/25/2014, 11:10 AM  Attending:  I have seen and examined the patient with nurse practitioner/resident and agree with the note above.   He needs to hold the lasix until 5/9, then check BMET again before resuming at 40mg  daily  He needs to f/u with [facility] in 4 weeks  Discharge to SNF  Roselie Awkward, MD [place] PCCM Pager: [phone] Cell: [phone] If no response, call [phone]

## 2014-09-25 NOTE — Care Management (Addendum)
Medicare Important Message given? Yes, discussed via phone with grandson Dian Situ.  CM KW (If Response is "NO", the following Medicare IM given date fields will be blank) Date Medicare IM given:  09/25/15 Medicare IM given by: Dahlia Client, Steffanie Dunn

## 2014-09-25 NOTE — Clinical Social Work Placement (Signed)
   CLINICAL SOCIAL WORK PLACEMENT  NOTE  Date:  09/25/2014  Patient Details  Name: Anthony Sparks MRN: [id number] Date of Birth: December 28, 1927  Clinical Social Work is seeking post-discharge placement for this patient at the [place] level of care (*CSW will initial, date and re-position this form in  chart as items are completed):  No   Patient/family provided with [place] Work Department's list of facilities offering this level of care within the geographic area requested by the patient (or if unable, by the patient's family).  Yes   Patient/family informed of their freedom to choose among providers that offer the needed level of care, that participate in Medicare, Medicaid or managed care program needed by the patient, have an available bed and are willing to accept the patient.  Yes   Patient/family informed of [place]'s ownership interest in [facility] and [facility], as well as of the fact that they are under no obligation to receive care at these facilities.  PASRR submitted to EDS on 10/01/10     PASRR number received on 10/01/10     Existing PASRR number confirmed on 09/24/14     FL2 transmitted to all facilities in geographic area requested by pt/family on 09/24/14     FL2 transmitted to all facilities within larger geographic area on 09/24/14     Patient informed that his/her managed care company has contracts with or will negotiate with certain facilities, including the following:        Yes   Patient/family informed of bed offers received.  Patient chooses bed at        Physician recommends and patient chooses bed at      Patient to be transferred to   on  .  Patient to be transferred to facility by       Patient family notified on   of transfer.  Name of family member notified:        PHYSICIAN Please prepare priority discharge summary, including medications, Please sign FL2     Additional Comment:  09/25/14:  Mrs. Daus's SNF preference-[facility] is out of network for patient's insurance. Talked with wife on 5/6 and she would like [facility] or [facility].    _______________________________________________ Sable Feil, LCSW 09/25/2014, 10:27 AM

## 2014-09-25 NOTE — Progress Notes (Addendum)
CSW (Clinical Education officer, museum) aware of plan for dc today. Pt wife preferred facility out of network with insurance. Pt wife aware and left message for previous CSW informing of new preferences that are in network. Unfortuntley CSW unsure if these preferred facilities will be able to receive insurance authorization today. Awaiting call back from [facility] after review of pt clinicals. CSW also left voicemail for pt wife to provide update.   ADDENDUM 12:00pm: CSW received call back from pt wife. Pt wife stated she will need CSW to speak with her son. Pt wife to provide CSW phone number to her son and ask that he call CSW as soon as possible to discuss pt dc plan.  [place], [place]

## 2014-09-25 NOTE — Progress Notes (Signed)
DC summary and instructions in packet for transfer to [facility].  Pt has no s/s of any acute distress at time of dc.  IV and telemetry dc'd.

## 2014-09-25 NOTE — Clinical Social Work Placement (Signed)
   CLINICAL SOCIAL WORK PLACEMENT  NOTE  Date:  09/25/2014  Patient Details  Name: Anthony Sparks MRN: [id number] Date of Birth: July 31, 1927  Clinical Social Work is seeking post-discharge placement for this patient at the [place] level of care (*CSW will initial, date and re-position this form in  chart as items are completed):  No   Patient/family provided with [place] Work Department's list of facilities offering this level of care within the geographic area requested by the patient (or if unable, by the patient's family).  Yes   Patient/family informed of their freedom to choose among providers that offer the needed level of care, that participate in Medicare, Medicaid or managed care program needed by the patient, have an available bed and are willing to accept the patient.  Yes   Patient/family informed of [facility]'s ownership interest in [facility] and [facility], as well as of the fact that they are under no obligation to receive care at these facilities.  PASRR submitted to EDS on 10/01/10     PASRR number received on 10/01/10     Existing PASRR number confirmed on 09/24/14     FL2 transmitted to all facilities in geographic area requested by pt/family on 09/24/14     FL2 transmitted to all facilities within larger geographic area on 09/24/14     Patient informed that his/her managed care company has contracts with or will negotiate with certain facilities, including the following:        Yes   Patient/family informed of bed offers received.  Patient chooses bed at  ([facility])     Physician recommends and patient chooses bed at      Patient to be transferred to   on 09/25/14.  Patient to be transferred to facility by t     Patient family notified on 09/25/14 of transfer.  Name of family member notified:   (drew-g-son)     PHYSICIAN Please prepare priority discharge summary, including medications, Please sign FL2      Additional Comment:    _______________________________________________ Roanna Raider, LCSW 09/25/2014, 8:45 PM

## 2014-09-25 NOTE — Progress Notes (Signed)
Pt to d/c to [facility] this pm.  Family and pt agreeable to plan.  [facility] has been called to provide transportation.

## 2014-10-05 ENCOUNTER — Telehealth: Payer: Self-pay | Admitting: Family Medicine

## 2014-10-05 NOTE — Telephone Encounter (Signed)
Called to let pt know about being accepted into practice and to set up an apt with Dr Dennard Schaumann

## 2014-10-08 ENCOUNTER — Encounter: Payer: Self-pay | Admitting: *Deleted

## 2014-10-27 ENCOUNTER — Ambulatory Visit (INDEPENDENT_AMBULATORY_CARE_PROVIDER_SITE_OTHER): Payer: Medicare HMO | Admitting: Family Medicine

## 2014-10-27 ENCOUNTER — Encounter: Payer: Self-pay | Admitting: Family Medicine

## 2014-10-27 VITALS — BP 100/60 | HR 84 | Temp 97.8°F | Resp 14 | Ht 68.0 in | Wt 172.0 lb

## 2014-10-27 DIAGNOSIS — F0390 Unspecified dementia without behavioral disturbance: Secondary | ICD-10-CM

## 2014-10-27 DIAGNOSIS — M109 Gout, unspecified: Secondary | ICD-10-CM | POA: Insufficient documentation

## 2014-10-27 DIAGNOSIS — Z7189 Other specified counseling: Secondary | ICD-10-CM | POA: Diagnosis not present

## 2014-10-27 DIAGNOSIS — K253 Acute gastric ulcer without hemorrhage or perforation: Secondary | ICD-10-CM | POA: Diagnosis not present

## 2014-10-27 DIAGNOSIS — Z23 Encounter for immunization: Secondary | ICD-10-CM

## 2014-10-27 DIAGNOSIS — Z7689 Persons encountering health services in other specified circumstances: Secondary | ICD-10-CM

## 2014-10-27 DIAGNOSIS — K259 Gastric ulcer, unspecified as acute or chronic, without hemorrhage or perforation: Principal | ICD-10-CM

## 2014-10-27 MED ORDER — PANTOPRAZOLE SODIUM 40 MG PO TBEC: 40.0000 mg | DELAYED_RELEASE_TABLET | Freq: Two times a day (BID) | ORAL | Status: DC

## 2014-10-27 NOTE — Addendum Note (Signed)
Addended by: Shary Decamp B on: 10/27/2014 04:44 PM   Modules accepted: Orders

## 2014-10-27 NOTE — Progress Notes (Signed)
Subjective:    Patient ID: Anthony Sparks, male    DOB: 1927/06/16, 81 y.o.   MRN: [id number]  HPI   patient is here today to establish care. He was recently admitted to the hospital with an upper GI bleed. EGD revealed gastric ulcers and duodenal ulcers attributed to long-standing NSAID use. He was treated with a protonix  IV drip.   He was supposed to be transitioned to protonix  Twice a day. However at the present time he is not taking a proton pump inhibitor. He denies any melanotic or hematochezia. He is accompanied today by his wife. The patient does not speak very much at all. His wife states that he sits in the room by himself. He simply sits in a chair. He does not watch TV. He is not active. I asked the patient if he feels depressed and he says no. However I ask him what makes him happy and he is unable to give me an answer. He seems very confused and unable to answer my questions. Therefore I performed a Mini-Mental status exam. A shunt is unable to tell me the month the day of the year. He is able to tell me the location. He is unable to perform serial sevens. He is unable to spell world in reverse. He is able to remember 1 out of 3 objects on rapid recall. When asked the patient to draw the face of a clock he is unable to put the hands of the clock in the appropriate position nor is he able to show the correct time. Furthermore he is only able to write 2 numbers and they're located in the wrong area.   I am concerned that the patient has moderate to severe dementia and that he sits quietly at home and since he does not talk very much,  It has gone unnoticed. He also has a history of chronic kidney disease for which he takes Lasix 80 mg by mouth daily for swelling. On examination today his legs show chronic venous stasis changes and venous stasis dermatitis. There is no pitting edema. Past Medical History  Diagnosis Date  . Hypertension   . Blood transfusion without reported diagnosis   .  Prostate cancer 2012  . Gout    Past Surgical History  Procedure Laterality Date  . Esophagogastroduodenoscopy N/A 09/19/2014    Procedure: ESOPHAGOGASTRODUODENOSCOPY (EGD);  Surgeon: [name], MD;  Location: [facility] ENDOSCOPY;  Service: Endoscopy;  Laterality: N/A;   No current outpatient prescriptions on file prior to visit.   No current facility-administered medications on file prior to visit.   Allergies  Allergen Reactions  . Augmentin [Amoxicillin-Pot Clavulanate] Nausea Only    Stomach pain  . Sulfa Antibiotics Hives   History   Social History  . Marital Status: Married    Spouse Name: N/A  . Number of Children: N/A  . Years of Education: N/A   Occupational History  . Not on file.   Social History Main Topics  . Smoking status: Former Smoker    Types: Cigarettes  . Smokeless tobacco: Never Used  . Alcohol Use: No  . Drug Use: No  . Sexual Activity: Not Currently   Other Topics Concern  . Not on file   Social History Narrative   Family History  Problem Relation Age of [place]  . Family history unknown: Yes     Review of Systems  All other systems reviewed and are negative.      Objective:   Physical  Exam  Constitutional: He appears well-developed and well-nourished.  Neck: Neck supple. No JVD present. No thyromegaly present.  Cardiovascular: Normal rate, regular rhythm and normal heart sounds.   No murmur heard. Pulmonary/Chest: Effort normal and breath sounds normal. No respiratory distress. He has no wheezes. He has no rales. He exhibits no tenderness.  Abdominal: Soft. Bowel sounds are normal. He exhibits no distension and no mass. There is no tenderness. There is no rebound and no guarding.  Musculoskeletal: Normal range of motion. He exhibits no edema.  Lymphadenopathy:    He has no cervical adenopathy.  Neurological: He is alert. He exhibits normal muscle tone. Coordination normal.  Skin: Rash noted.  Psychiatric: Cognition and memory are  impaired.  Vitals reviewed.         Assessment & Plan:  NSAID-induced gastric ulcer, acute - Plan: pantoprazole (PROTONIX) 40 MG tablet  Dementia, without behavioral disturbance - Plan: COMPLETE METABOLIC PANEL WITH GFR, CBC with Differential/Platelet, TSH, Vitamin B12, Methylmalonic Acid, Serum, Ambulatory referral to Neurology  Establishing care with new doctor, encounter for   I am very concerned by the fact the patient is not on a PPI given his recent upper GI bleed. Therefore I recommended that they start Protonix 40 mg by mouth twice a day for at least 3 months and then once daily thereafter.   Patient certainly may be battling depression but I'm concerned he has moderate to severe dementia. Therefore I will check a CBC, CMP, TSH, vitamin B12, and a methylmalonic acid level. If underlying causes her not determined , I would like to start the patient on Aricept and Namenda. However the patient's wife seems very hesitant to accept the diagnosis that the patient may have dementia and therefore I would like to schedule him to see a neurologist for a second opinion. Patient received Pneumovax 23 today in clinic. Given his age and his Felty I would not recommend a colonoscopy. He has a history of prostate cancer but he refuses any treatment and therefore I do not see the utility in checking a PSA.

## 2014-10-28 LAB — CBC WITH DIFFERENTIAL/PLATELET
BASOS ABS: 0 10*3/uL (ref 0.0–0.1)
Basophils Relative: 0 % (ref 0–1)
EOS ABS: 0.1 10*3/uL (ref 0.0–0.7)
EOS PCT: 1 % (ref 0–5)
HEMATOCRIT: 40.7 % (ref 39.0–52.0)
Hemoglobin: 13.2 g/dL (ref 13.0–17.0)
Lymphocytes Relative: 33 % (ref 12–46)
Lymphs Abs: 2.9 10*3/uL (ref 0.7–4.0)
MCH: 29.6 pg (ref 26.0–34.0)
MCHC: 32.4 g/dL (ref 30.0–36.0)
MCV: 91.3 fL (ref 78.0–100.0)
MPV: 9.5 fL (ref 8.6–12.4)
Monocytes Absolute: 0.7 10*3/uL (ref 0.1–1.0)
Monocytes Relative: 8 % (ref 3–12)
NEUTROS ABS: 5.2 10*3/uL (ref 1.7–7.7)
Neutrophils Relative %: 58 % (ref 43–77)
PLATELETS: 259 10*3/uL (ref 150–400)
RBC: 4.46 MIL/uL (ref 4.22–5.81)
RDW: 15.6 % — AB (ref 11.5–15.5)
WBC: 8.9 10*3/uL (ref 4.0–10.5)

## 2014-10-28 LAB — COMPLETE METABOLIC PANEL WITH GFR
ALBUMIN: 3.7 g/dL (ref 3.5–5.2)
ALT: 16 U/L (ref 0–53)
AST: 19 U/L (ref 0–37)
Alkaline Phosphatase: 73 U/L (ref 39–117)
BUN: 44 mg/dL — AB (ref 6–23)
CHLORIDE: 103 meq/L (ref 96–112)
CO2: 28 meq/L (ref 19–32)
Calcium: 9.9 mg/dL (ref 8.4–10.5)
Creat: 1.88 mg/dL — ABNORMAL HIGH (ref 0.50–1.35)
GFR, EST NON AFRICAN AMERICAN: 32 mL/min — AB
GFR, Est African American: 37 mL/min — ABNORMAL LOW
Glucose, Bld: 93 mg/dL (ref 70–99)
POTASSIUM: 5.8 meq/L — AB (ref 3.5–5.3)
SODIUM: 140 meq/L (ref 135–145)
Total Bilirubin: 0.6 mg/dL (ref 0.2–1.2)
Total Protein: 7.8 g/dL (ref 6.0–8.3)

## 2014-10-28 LAB — VITAMIN B12: Vitamin B-12: 629 pg/mL (ref 211–911)

## 2014-10-28 LAB — TSH: TSH: 2.852 u[IU]/mL (ref 0.350–4.500)

## 2014-10-30 LAB — METHYLMALONIC ACID, SERUM: METHYLMALONIC ACID, QUANT: 570 nmol/L — AB (ref 87–318)

## 2014-11-18 ENCOUNTER — Encounter: Payer: Self-pay | Admitting: *Deleted

## 2014-11-20 ENCOUNTER — Ambulatory Visit (INDEPENDENT_AMBULATORY_CARE_PROVIDER_SITE_OTHER): Payer: Medicare HMO | Admitting: Diagnostic Neuroimaging

## 2014-11-20 ENCOUNTER — Encounter: Payer: Self-pay | Admitting: *Deleted

## 2014-11-20 VITALS — BP 109/68 | HR 98 | Ht 66.0 in | Wt 174.0 lb

## 2014-11-20 DIAGNOSIS — F03B0 Unspecified dementia, moderate, without behavioral disturbance, psychotic disturbance, mood disturbance, and anxiety: Secondary | ICD-10-CM

## 2014-11-20 DIAGNOSIS — R413 Other amnesia: Secondary | ICD-10-CM | POA: Diagnosis not present

## 2014-11-20 DIAGNOSIS — F0390 Unspecified dementia without behavioral disturbance: Secondary | ICD-10-CM

## 2014-11-20 NOTE — Progress Notes (Signed)
[facility]  PATIENT: Anthony Sparks DOB: 09-12-1927  REFERRING CLINICIAN: W Pickard HISTORY FROM: patient and wife  REASON FOR VISIT: new consult    HISTORICAL  CHIEF COMPLAINT:  Chief Complaint  Patient presents with  . Dementia    rm 7,   internal referral  . New patient    wife -[name]    HISTORY OF PRESENT ILLNESS:   81 year old right-handed male here for evaluation of memory problems and dementia. Patient presented to PCP to establish care after GI bleeding. During evaluation PCP noted that patient had significant memory and cognitive problems. Patient referred to me for further evaluation. Patient denies any memory problems. He sitting comfortably in the examination room. His wife also denies any significant memory or cognitive problems. Patient's wife thinks that patient is just depressed. Patient retired in 2008 and since that time has had a very low activity level. She has not noted any significant change since that time. Previously patient was an Editor, commissioning. He has eighth grade education. Patient's wife always took care of finances and shopping needs. Patient still able to dress, bathe, feed himself. He last drove a car proximally one year ago. Wife is not officially taken keys away the patient does not drive that much anymore.  No wandering or behavioral changes. He has had some weight loss. To much sleep, decreased energy, disinterest in activities, runny nose, joint swelling, snoring, feeling cold, easy bruising per review of systems.   REVIEW OF SYSTEMS: Full 14 system review of systems performed and notable only for as per HPI.   ALLERGIES: Allergies  Allergen Reactions  . Augmentin [Amoxicillin-Pot Clavulanate] Nausea Only    Stomach pain  . Sulfa Antibiotics Hives    HOME MEDICATIONS: Outpatient Prescriptions Prior to Visit  Medication Sig Dispense Refill  . allopurinol (ZYLOPRIM) 100 MG tablet Take 100 mg by mouth daily.    .  furosemide (LASIX) 80 MG tablet Take 80 mg by mouth 2 (two) times daily.    . pantoprazole (PROTONIX) 40 MG tablet Take 1 tablet (40 mg total) by mouth 2 (two) times daily. 60 tablet 3   No facility-administered medications prior to visit.    PAST MEDICAL HISTORY: Past Medical History  Diagnosis Date  . Hypertension   . Blood transfusion without reported diagnosis   . Prostate cancer 2012  . Gout   . Macular degeneration of both eyes   . Kidney disease     born with one kidney    PAST SURGICAL HISTORY: Past Surgical History  Procedure Laterality Date  . Esophagogastroduodenoscopy N/A 09/19/2014    Procedure: ESOPHAGOGASTRODUODENOSCOPY (EGD);  Surgeon: [name], MD;  Location: [facility] ENDOSCOPY;  Service: Endoscopy;  Laterality: N/A;  . Cataract surgery Bilateral     6-7 yrs ago    FAMILY HISTORY: Family History  Problem Relation Age of Onset  . Family history unknown: Yes    SOCIAL HISTORY:  History   Social History  . Marital Status: Married    Spouse Name: betty  . Number of Children: 1  . Years of Education: 8   Occupational History  . retired     Marine scientist   Social History Main Topics  . Smoking status: Former Smoker    Types: Cigarettes    Quit date: 11/20/1978  . Smokeless tobacco: Never Used  . Alcohol Use: No     Comment: quit 07/2014  . Drug Use: No  . Sexual Activity: Not Currently   Other Topics Concern  .  Not on file   Social History Narrative   Lives at home with wife    caffeine use - decaff tea     PHYSICAL EXAM   GENERAL EXAM/CONSTITUTIONAL: Vitals:  Filed Vitals:   11/20/14 1040  BP: 109/68  Pulse: 98  Height: 5\' 6"  (1.676 m)  Weight: 174 lb (78.926 kg)     Body mass index is 28.1 kg/(m^2).  No exam data present  Patient is in no distress; well developed, nourished and groomed; neck is supple  CARDIOVASCULAR:  Examination of carotid arteries is normal; no carotid bruits  Regular rate and rhythm, no  murmurs  Examination of peripheral vascular system by observation and palpation is normal  EYES:  BILATERAL MACULAR DEGENERATION AND UNABLE TO READ; ophthalmoscopic exam of optic discs and posterior segments is normal; no papilledema or hemorrhages  MUSCULOSKELETAL:  Gait, strength, tone, movements noted in Neurologic exam below  NEUROLOGIC: MENTAL STATUS:  MMSE - [place] Exam 11/20/2014  Orientation to time 4  Orientation to Place 2  Registration 3  Attention/ Calculation 0  Recall 1  Language- name 2 objects 2  Language- repeat 0  Language- follow 3 step command 3  Language- read & follow direction 0  Write a sentence 0  Copy design 0  Total score 15    awake, alert, oriented to person, year, date and week; not season   registers 3/3; recalls 1/3  Decreased attention and concentration  language fluent, comprehension intact, naming intact  fund of knowledge appropriate  CRANIAL NERVE:   2nd - no papilledema on fundoscopic exam  2nd, 3rd, 4th, 6th - pupils equal and reactive to light, visual fields full to confrontation, extraocular muscles intact, no nystagmus  5th - facial sensation symmetric  7th - facial strength symmetric  8th - hearing intact  9th - palate elevates symmetrically, uvula midline  11th - shoulder shrug symmetric  12th - tongue protrusion midline  MOTOR:   RIGHT HAND RESTING TREMOR; BRADYKINESIA IN BUE; MILD COGWHEELING IN BUE; normal bulk and tone, DIFFUSE 4/5 STRENGTH  SENSORY:   normal and symmetric to light touch; DECR VIB AT TOES  COORDINATION:   finger-nose-finger, fine finger movements SLOW   REFLEXES:   deep tendon reflexes present and symmetric; TRACE IN BLE  GAIT/STATION:   narrow based gait; SLOW TO RISE; USES WALKER; STOOPED POSTURE     DIAGNOSTIC DATA (LABS, IMAGING, TESTING) - I reviewed patient records, labs, notes, testing and imaging myself where available.  Lab Results  Component Value  Date   WBC 8.9 10/27/2014   HGB 13.2 10/27/2014   HCT 40.7 10/27/2014   MCV 91.3 10/27/2014   PLT 259 10/27/2014      Component Value Date/Time   NA 140 10/27/2014 1529   K 5.8* 10/27/2014 1529   CL 103 10/27/2014 1529   CO2 28 10/27/2014 1529   GLUCOSE 93 10/27/2014 1529   BUN 44* 10/27/2014 1529   CREATININE 1.88* 10/27/2014 1529   CREATININE 2.25* 09/25/2014 0334   CALCIUM 9.9 10/27/2014 1529   PROT 7.8 10/27/2014 1529   ALBUMIN 3.7 10/27/2014 1529   AST 19 10/27/2014 1529   ALT 16 10/27/2014 1529   ALKPHOS 73 10/27/2014 1529   BILITOT 0.6 10/27/2014 1529   GFRNONAA 32* 10/27/2014 1529   GFRNONAA 25* 09/25/2014 0334   GFRAA 37* 10/27/2014 1529   GFRAA 29* 09/25/2014 0334   No results found for: CHOL, HDL, LDLCALC, LDLDIRECT, TRIG, CHOLHDL No results found for: HGBA1C  Lab Results  Component Value Date   HBZJIRCV89 381 10/27/2014   Lab Results  Component Value Date   TSH 2.852 10/27/2014    09/23/14 CXR [I reviewed images myself and agree with interpretation. -VRP]  - Left lower lobe consolidation and small effusion.    ASSESSMENT AND PLAN  81 y.o. year old male here with progressive memory loss and cognitive decline, with baseline low education, low functional status her since 2008 retirement. Most likely represents neurodegenerative dementia (Alzheimer's versus dementia with Lewy bodies). So far patient is doing well at home and patient's wife feels comfortable taking care of him. I reviewed diagnosis, prognosis, treatment options. I think the most important management going forward is safety and supervision for patient. I offered donepezil or memantine medication to help slow down progression, but given patient's age, functional status and cognitive testing level, we have decided to hold off for now.  Dx:   Moderate dementia, without behavioral disturbance - Plan: CT Head Wo Contrast  Memory loss - Plan: CT Head Wo Contrast   PLAN: - monitor symptoms -  check CT head - caution with ADLs, safety - no driving  Orders Placed This Encounter  Procedures  . CT Head Wo Contrast   Return in about 6 months (around 05/23/2015).    Penni Bombard, MD 0/05/7508, 25:85 AM Certified in Neurology, Neurophysiology and Neuroimaging  [facility] [address], [place], [place] [address] [phone]

## 2014-11-20 NOTE — Patient Instructions (Signed)
-   I will check CT head 

## 2015-05-25 ENCOUNTER — Ambulatory Visit: Payer: Medicare HMO | Admitting: Diagnostic Neuroimaging

## 2015-07-09 DIAGNOSIS — N183 Chronic kidney disease, stage 3 (moderate): Secondary | ICD-10-CM | POA: Diagnosis not present

## 2015-09-27 ENCOUNTER — Telehealth: Payer: Self-pay | Admitting: Family Medicine

## 2015-09-27 MED ORDER — NIFEDIPINE ER OSMOTIC RELEASE 30 MG PO TB24: 30.0000 mg | ORAL_TABLET | Freq: Every day | ORAL | Status: DC

## 2015-09-27 NOTE — Telephone Encounter (Signed)
Pt's pharmacist called to see if we can call him in enough Procardia XL 30 mg 1x a day to last him until he can come back in to see Dr. Dennard Schaumann to follow up. Pharmacist will have his wife call to schedule an appt for him. [place]

## 2015-09-27 NOTE — Telephone Encounter (Signed)
Medication called/sent to requested pharmacy  

## 2015-09-28 ENCOUNTER — Encounter: Payer: Self-pay | Admitting: Family Medicine

## 2015-09-28 ENCOUNTER — Ambulatory Visit (INDEPENDENT_AMBULATORY_CARE_PROVIDER_SITE_OTHER): Payer: PPO | Admitting: Family Medicine

## 2015-09-28 VITALS — BP 164/108 | HR 98 | Temp 98.9°F | Resp 18 | Wt 188.0 lb

## 2015-09-28 DIAGNOSIS — I10 Essential (primary) hypertension: Secondary | ICD-10-CM | POA: Diagnosis not present

## 2015-09-28 DIAGNOSIS — J208 Acute bronchitis due to other specified organisms: Secondary | ICD-10-CM

## 2015-09-28 DIAGNOSIS — K253 Acute gastric ulcer without hemorrhage or perforation: Secondary | ICD-10-CM | POA: Diagnosis not present

## 2015-09-28 DIAGNOSIS — K259 Gastric ulcer, unspecified as acute or chronic, without hemorrhage or perforation: Secondary | ICD-10-CM

## 2015-09-28 LAB — COMPLETE METABOLIC PANEL WITH GFR
ALT: 17 U/L (ref 9–46)
AST: 21 U/L (ref 10–35)
Albumin: 3.9 g/dL (ref 3.6–5.1)
Alkaline Phosphatase: 44 U/L (ref 40–115)
BUN: 21 mg/dL (ref 7–25)
CO2: 21 mmol/L (ref 20–31)
CREATININE: 1.8 mg/dL — AB (ref 0.70–1.11)
Calcium: 9 mg/dL (ref 8.6–10.3)
Chloride: 102 mmol/L (ref 98–110)
GFR, Est African American: 39 mL/min — ABNORMAL LOW (ref >=60)
GFR, Est Non African American: 34 mL/min — ABNORMAL LOW (ref >=60)
Glucose, Bld: 99 mg/dL (ref 70–99)
Potassium: 4.7 mmol/L (ref 3.5–5.3)
Sodium: 138 mmol/L (ref 135–146)
Total Bilirubin: 0.7 mg/dL (ref 0.2–1.2)
Total Protein: 7.4 g/dL (ref 6.1–8.1)

## 2015-09-28 LAB — CBC WITH DIFFERENTIAL/PLATELET
BASOS PCT: 0 %
Basophils Absolute: 0 cells/uL (ref 0–200)
Eosinophils Absolute: 78 cells/uL (ref 15–500)
Eosinophils Relative: 1 %
HCT: 46 % (ref 38.5–50.0)
Hemoglobin: 15.7 g/dL (ref 13.0–17.0)
Lymphocytes Relative: 20 %
Lymphs Abs: 1560 cells/uL (ref 850–3900)
MCH: 33.8 pg — ABNORMAL HIGH (ref 27.0–33.0)
MCHC: 34.1 g/dL (ref 32.0–36.0)
MCV: 99.1 fL (ref 80.0–100.0)
MONO ABS: 858 {cells}/uL (ref 200–950)
MONOS PCT: 11 %
MPV: 9.4 fL (ref 7.5–12.5)
NEUTROS ABS: 5304 {cells}/uL (ref 1500–7800)
Neutrophils Relative %: 68 %
PLATELETS: 166 10*3/uL (ref 140–400)
RBC: 4.64 MIL/uL (ref 4.20–5.80)
RDW: 13.7 % (ref 11.0–15.0)
WBC: 7.8 10*3/uL (ref 3.8–10.8)

## 2015-09-28 LAB — LIPID PANEL
Cholesterol: 147 mg/dL (ref 125–200)
HDL: 50 mg/dL (ref >=40)
LDL CALC: 64 mg/dL (ref <130)
Total CHOL/HDL Ratio: 2.9 Ratio (ref <=5.0)
Triglycerides: 165 mg/dL — ABNORMAL HIGH (ref <150)
VLDL: 33 mg/dL — AB (ref <30)

## 2015-09-28 MED ORDER — AZITHROMYCIN 250 MG PO TABS: ORAL_TABLET | ORAL | Status: DC

## 2015-09-28 MED ORDER — NIFEDIPINE ER OSMOTIC RELEASE 30 MG PO TB24: 30.0000 mg | ORAL_TABLET | Freq: Every day | ORAL | Status: DC

## 2015-09-28 MED ORDER — PANTOPRAZOLE SODIUM 40 MG PO TBEC: 40.0000 mg | DELAYED_RELEASE_TABLET | Freq: Every day | ORAL | Status: AC

## 2015-09-28 NOTE — Progress Notes (Signed)
Subjective:    Patient ID: Anthony Sparks, male    DOB: 1928-01-27, 82 y.o.   MRN: [id number]  HPI  10/2014  patient is here today to establish care. He was recently admitted to the hospital with an upper GI bleed. EGD revealed gastric ulcers and duodenal ulcers attributed to long-standing NSAID use. He was treated with a protonix  IV drip.   He was supposed to be transitioned to protonix  Twice a day. However at the present time he is not taking a proton pump inhibitor. He denies any melanotic or hematochezia. He is accompanied today by his wife. The patient does not speak very much at all. His wife states that he sits in the room by himself. He simply sits in a chair. He does not watch TV. He is not active. I asked the patient if he feels depressed and he says no. However I ask him what makes him happy and he is unable to give me an answer. He seems very confused and unable to answer my questions. Therefore I performed a Mini-Mental status exam. A shunt is unable to tell me the month the day of the year. He is able to tell me the location. He is unable to perform serial sevens. He is unable to spell world in reverse. He is able to remember 1 out of 3 objects on rapid recall. When asked the patient to draw the face of a clock he is unable to put the hands of the clock in the appropriate position nor is he able to show the correct time. Furthermore he is only able to write 2 numbers and they're located in the wrong area.   I am concerned that the patient has moderate to severe dementia and that he sits quietly at home and since he does not talk very much,  It has gone unnoticed. He also has a history of chronic kidney disease for which he takes Lasix 80 mg by mouth daily for swelling. On examination today his legs show chronic venous stasis changes and venous stasis dermatitis. There is no pitting edema.  At that time, my plan was:  I am very concerned by the fact the patient is not on a PPI given his  recent upper GI bleed. Therefore I recommended that they start Protonix 40 mg by mouth twice a day for at least 3 months and then once daily thereafter.   Patient certainly may be battling depression but I'm concerned he has moderate to severe dementia. Therefore I will check a CBC, CMP, TSH, vitamin B12, and a methylmalonic acid level. If underlying causes her not determined , I would like to start the patient on Aricept and Namenda. However the patient's wife seems very hesitant to accept the diagnosis that the patient may have dementia and therefore I would like to schedule him to see a neurologist for a second opinion. Patient received Pneumovax 23 today in clinic. Given his age and his frailty I would not recommend a colonoscopy. He has a history of prostate cancer but he refuses any treatment and therefore I do not see the utility in checking a PSA.  09/28/15 Patient has not been seen since. He did see neurology who also felt that he has dementia however the patient will not take any medication for memory loss. His blood pressure is extremely elevated today. He has been off nifedipine for 3 days. However his blood pressure seems drastically elevated even have been off nifedipine. He is also had  a 3 day history of productive cough. On examination today, he has left basilar Rales with faint expiratory wheezing consistent with bronchitis or walking pneumonia. He denies any fevers or chills or hemoptysis. He has independently stopped taking his allopurinol and his pantoprazole at home because he does not like to take pills and I believe in part due to confusion with dementia. However, he states he is compliant with his blood pressure medication. He has a large nevus on his left cheek. At the superior left portion/11:00, there is a very irregular atypical dark patch within the nevus concerning for atypia/possible melanoma. I recommended a dermatologist consultation but he declined Past Medical History  Diagnosis  Date  . Hypertension   . Blood transfusion without reported diagnosis   . Prostate cancer ([place]) 2012  . Gout   . Macular degeneration of both eyes   . Kidney disease     born with one kidney   Past Surgical History  Procedure Laterality Date  . Esophagogastroduodenoscopy N/A 09/19/2014    Procedure: ESOPHAGOGASTRODUODENOSCOPY (EGD);  Surgeon: [name], MD;  Location: [facility] ENDOSCOPY;  Service: Endoscopy;  Laterality: N/A;  . Cataract surgery Bilateral     6-7 yrs ago   Current Outpatient Prescriptions on File Prior to Visit  Medication Sig Dispense Refill  . acetaminophen (TYLENOL) 500 MG tablet Take 500 mg by mouth every 6 (six) hours as needed.    Marland Kitchen allopurinol (ZYLOPRIM) 100 MG tablet Take 100 mg by mouth daily.    . furosemide (LASIX) 80 MG tablet Take 80 mg by mouth 2 (two) times daily.    Marland Kitchen NIFEdipine (PROCARDIA-XL/ADALAT-CC/NIFEDICAL-XL) 30 MG 24 hr tablet Take 1 tablet (30 mg total) by mouth daily. 30 tablet 0  . pantoprazole (PROTONIX) 40 MG tablet Take 1 tablet (40 mg total) by mouth 2 (two) times daily. 60 tablet 3   No current facility-administered medications on file prior to visit.   Allergies  Allergen Reactions  . Augmentin [Amoxicillin-Pot Clavulanate] Nausea Only    Stomach pain  . Sulfa Antibiotics Hives   Social History   Social History  . Marital Status: Married    Spouse Name: betty  . Number of Children: 1  . Years of Education: 8   Occupational History  . retired     Marine scientist   Social History Main Topics  . Smoking status: Former Smoker    Types: Cigarettes    Quit date: 11/20/1978  . Smokeless tobacco: Never Used  . Alcohol Use: No     Comment: quit 07/2014  . Drug Use: No  . Sexual Activity: Not Currently   Other Topics Concern  . Not on file   Social History Narrative   Lives at home with wife    caffeine use - decaff tea   Family History  Problem Relation Age of Onset  . Family history unknown: Yes     Review of Systems    All other systems reviewed and are negative.      Objective:   Physical Exam  Constitutional: He appears well-developed and well-nourished.  Neck: Neck supple. No JVD present. No thyromegaly present.  Cardiovascular: Normal rate, regular rhythm and normal heart sounds.   No murmur heard. Pulmonary/Chest: Effort normal. No respiratory distress. He has wheezes. He has rales. He exhibits no tenderness.  Abdominal: Soft. Bowel sounds are normal. He exhibits no distension and no mass. There is no tenderness. There is no rebound and no guarding.  Musculoskeletal: Normal range of motion. He  exhibits no edema.  Lymphadenopathy:    He has no cervical adenopathy.  Neurological: He is alert. He exhibits normal muscle tone. Coordination normal.  Skin: Rash noted.  Psychiatric: Cognition and memory are impaired.  Vitals reviewed.         Assessment & Plan:  Acute bronchitis due to other specified organisms - Plan: azithromycin (ZITHROMAX) 250 MG tablet  NSAID-induced gastric ulcer, acute - Plan: pantoprazole (PROTONIX) 40 MG tablet  Benign essential HTN - Plan: CBC with Differential/Platelet, COMPLETE METABOLIC PANEL WITH GFR, Lipid panel  Begin a Z-Pak. Recheck his lungs in one week. Should his breathing worsen I want him to return immediately. Resume nifedipine immediately and recheck blood pressure in one week. I believe his blood pressure is drastically elevated even to have not been on nifedipine. He may require more medicine for his blood pressure. I will check fasting lab work today. Patient does not want to see a dermatologist and therefore I suggested that we perform a biopsy of the atypical region in the large nevus on his left cheek.  He consents to this and will allow me to perform this next week when he is feeling better. I strongly recommended that he resume pantoprazole 40 mg once a day.

## 2015-10-05 ENCOUNTER — Encounter: Payer: Self-pay | Admitting: Family Medicine

## 2015-10-05 ENCOUNTER — Ambulatory Visit (INDEPENDENT_AMBULATORY_CARE_PROVIDER_SITE_OTHER): Payer: PPO | Admitting: Family Medicine

## 2015-10-05 VITALS — BP 132/86 | HR 76 | Temp 98.0°F | Resp 20 | Wt 188.0 lb

## 2015-10-05 DIAGNOSIS — J208 Acute bronchitis due to other specified organisms: Secondary | ICD-10-CM

## 2015-10-05 DIAGNOSIS — D485 Neoplasm of uncertain behavior of skin: Secondary | ICD-10-CM

## 2015-10-05 DIAGNOSIS — I10 Essential (primary) hypertension: Secondary | ICD-10-CM | POA: Diagnosis not present

## 2015-10-05 NOTE — Progress Notes (Signed)
Subjective:    Patient ID: Anthony Sparks, male    DOB: [birth year], 82 y.o.   MRN: [id number]  HPI  10/2014 Patient is here today to establish care. He was recently admitted to the hospital with an upper GI bleed. EGD revealed gastric ulcers and duodenal ulcers attributed to long-standing NSAID use. He was treated with a protonix  IV drip.   He was supposed to be transitioned to protonix  Twice a day. However at the present time he is not taking a proton pump inhibitor. He denies any melanotic or hematochezia. He is accompanied today by his wife. The patient does not speak very much at all. His wife states that he sits in the room by himself. He simply sits in a chair. He does not watch TV. He is not active. I asked the patient if he feels depressed and he says no. However I ask him what makes him happy and he is unable to give me an answer. He seems very confused and unable to answer my questions. Therefore I performed a Mini-Mental status exam. A shunt is unable to tell me the month the day of the year. He is able to tell me the location. He is unable to perform serial sevens. He is unable to spell world in reverse. He is able to remember 1 out of 3 objects on rapid recall. When asked the patient to draw the face of a clock he is unable to put the hands of the clock in the appropriate position nor is he able to show the correct time. Furthermore he is only able to write 2 numbers and they're located in the wrong area.   I am concerned that the patient has moderate to severe dementia and that he sits quietly at home and since he does not talk very much,  It has gone unnoticed. He also has a history of chronic kidney disease for which he takes Lasix 80 mg by mouth daily for swelling. On examination today his legs show chronic venous stasis changes and venous stasis dermatitis. There is no pitting edema.At that time, my plan was:   I am very concerned by the fact the patient is not on a PPI given his  recent upper GI bleed. Therefore I recommended that they start Protonix 40 mg by mouth twice a day for at least 3 months and then once daily thereafter.   Patient certainly may be battling depression but I'm concerned he has moderate to severe dementia. Therefore I will check a CBC, CMP, TSH, vitamin B12, and a methylmalonic acid level. If underlying causes her not determined , I would like to start the patient on Aricept and Namenda. However the patient's wife seems very hesitant to accept the diagnosis that the patient may have dementia and therefore I would like to schedule him to see a neurologist for a second opinion. Patient received Pneumovax 23 today in clinic. Given his age and his frailty I would not recommend a colonoscopy. He has a history of prostate cancer but he refuses any treatment and therefore I do not see the utility in checking a PSA.  09/28/15 Patient has not been seen since. He did see neurology who also felt that he has dementia however the patient will not take any medication for memory loss. His blood pressure is extremely elevated today. He has been off nifedipine for 3 days. However his blood pressure seems drastically elevated even have been off nifedipine. He is also had a 3  day history of productive cough. On examination today, he has left basilar Rales with faint expiratory wheezing consistent with bronchitis or walking pneumonia. He denies any fevers or chills or hemoptysis. He has independently stopped taking his allopurinol and his pantoprazole at home because he does not like to take pills and I believe in part due to confusion with dementia. However, he states he is compliant with his blood pressure medication. He has a large nevus on his left cheek. At the superior left portion/11:00, there is a very irregular atypical dark patch within the nevus concerning for atypia/possible melanoma. I recommended a dermatologist consultation but he declined.  At that time, my plan  was: Begin a Z-Pak. Recheck his lungs in one week. Should his breathing worsen I want him to return immediately. Resume nifedipine immediately and recheck blood pressure in one week. I believe his blood pressure is drastically elevated even to have not been on nifedipine. He may require more medicine for his blood pressure. I will check fasting lab work today. Patient does not want to see a dermatologist and therefore I suggested that we perform a biopsy of the atypical region in the large nevus on his left cheek.  He consents to this and will allow me to perform this next week when he is feeling better. I strongly recommended that he resume pantoprazole 40 mg once a day.  10/05/15 Fortunately, the patient's blood pressure is much better today at 132/86. He has been compliant taking his medication. He is not taking his Lasix however and he has significant swelling in both legs. Unfortunately the patient is noncompliant and I believe some of that can be due to his dementia. His cough is much better after having completed the antibiotic. He has a slight residual cough but it is markedly and better. He is here today for the biopsy of the lesion on his left temple/left cheek just anterior to his ear. There is a large brown nevus approximately the size of a golf ball in that location. The left superior quadrant however has dark black discoloration in that area with irregular borders concerning for possible melanoma. Past Medical History  Diagnosis Date  . Hypertension   . Blood transfusion without reported diagnosis   . Prostate cancer ([place]) 2012  . Gout   . Macular degeneration of both eyes   . Kidney disease     born with one kidney   Past Surgical History  Procedure Laterality Date  . Esophagogastroduodenoscopy N/A 09/19/2014    Procedure: ESOPHAGOGASTRODUODENOSCOPY (EGD);  Surgeon: [name], MD;  Location: [facility] ENDOSCOPY;  Service: Endoscopy;  Laterality: N/A;  . Cataract surgery Bilateral      6-7 yrs ago   Current Outpatient Prescriptions on File Prior to Visit  Medication Sig Dispense Refill  . acetaminophen (TYLENOL) 500 MG tablet Take 500 mg by mouth every 6 (six) hours as needed.    Marland Kitchen allopurinol (ZYLOPRIM) 100 MG tablet Take 100 mg by mouth daily.    Marland Kitchen azithromycin (ZITHROMAX) 250 MG tablet 2 tabs poqday1, 1 tab poqday 2-5 6 tablet 0  . furosemide (LASIX) 80 MG tablet Take 80 mg by mouth 2 (two) times daily.    Marland Kitchen NIFEdipine (PROCARDIA-XL/ADALAT-CC/NIFEDICAL-XL) 30 MG 24 hr tablet Take 1 tablet (30 mg total) by mouth daily. 30 tablet 11  . pantoprazole (PROTONIX) 40 MG tablet Take 1 tablet (40 mg total) by mouth daily. 30 tablet 11   No current facility-administered medications on file prior to visit.  Allergies  Allergen Reactions  . Augmentin [Amoxicillin-Pot Clavulanate] Nausea Only    Stomach pain  . Sulfa Antibiotics Hives   Social History   Social History  . Marital Status: Married    Spouse Name: betty  . Number of Children: 1  . Years of Education: 8   Occupational History  . retired     Marine scientist   Social History Main Topics  . Smoking status: Former Smoker    Types: Cigarettes    Quit date: 11/20/1978  . Smokeless tobacco: Never Used  . Alcohol Use: No     Comment: quit 07/2014  . Drug Use: No  . Sexual Activity: Not Currently   Other Topics Concern  . Not on file   Social History Narrative   Lives at home with wife    caffeine use - decaff tea   Family History  Problem Relation Age of Onset  . Family history unknown: Yes     Review of Systems  All other systems reviewed and are negative.      Objective:   Physical Exam  Constitutional: He appears well-developed and well-nourished.  Neck: Neck supple. No JVD present. No thyromegaly present.  Cardiovascular: Normal rate, regular rhythm and normal heart sounds.   No murmur heard. Pulmonary/Chest: Effort normal. No respiratory distress. He has wheezes. He has rales. He exhibits no  tenderness.  Abdominal: Soft. Bowel sounds are normal. He exhibits no distension and no mass. There is no tenderness. There is no rebound and no guarding.  Musculoskeletal: Normal range of motion. He exhibits no edema.  Lymphadenopathy:    He has no cervical adenopathy.  Neurological: He is alert. He exhibits normal muscle tone. Coordination normal.  Skin: Rash noted.  Psychiatric: Cognition and memory are impaired.  Vitals reviewed.         Assessment & Plan:  Neoplasm of uncertain behavior of skin - Plan: Pathology  Benign essential HTN  Acute bronchitis due to other specified organisms  Blood pressures acceptable. I'll make no changes today and his blood pressure medication. I urged compliance with his Lasix. His cough is much better and his bronchitis seems to resolve. I am very concerned by the nevus on the left side of his face. I anesthetized that area was 0.1% lidocaine and performed a shave biopsy and Compazine approximately 7-8 mm of the atypical area described in the history of present illness. Hemostasis was achieved with Drysol. The lesion was sent to pathology and labeled container. Of note, the patient has dementia which he declines treatment for. He has a very flat affect. He also demonstrates bradykinesia. Today on his exam for the first time I noticed a pill-rolling tremor in his right hand all which is concerning for Parkinson-like features. I discussed this with the patient and his wife that they declined referral to a neurologist at the present time

## 2015-10-11 LAB — PATHOLOGY

## 2015-10-13 ENCOUNTER — Other Ambulatory Visit: Payer: Self-pay | Admitting: Family Medicine

## 2015-10-13 DIAGNOSIS — D492 Neoplasm of unspecified behavior of bone, soft tissue, and skin: Secondary | ICD-10-CM

## 2015-10-26 DIAGNOSIS — D485 Neoplasm of uncertain behavior of skin: Secondary | ICD-10-CM | POA: Diagnosis not present

## 2015-10-26 DIAGNOSIS — C4339 Malignant melanoma of other parts of face: Secondary | ICD-10-CM | POA: Diagnosis not present

## 2015-10-28 ENCOUNTER — Other Ambulatory Visit: Payer: Self-pay | Admitting: Family Medicine

## 2015-10-28 NOTE — Telephone Encounter (Signed)
Pt's wife is calling to request a refill of Furosemide 80 mg [place].

## 2015-10-29 DIAGNOSIS — C4330 Malignant melanoma of unspecified part of face: Secondary | ICD-10-CM | POA: Diagnosis not present

## 2015-10-29 MED ORDER — FUROSEMIDE 80 MG PO TABS: 80.0000 mg | ORAL_TABLET | Freq: Two times a day (BID) | ORAL | Status: AC

## 2015-10-29 NOTE — Telephone Encounter (Signed)
Prescription sent to pharmacy.

## 2015-10-30 ENCOUNTER — Other Ambulatory Visit: Payer: Self-pay | Admitting: Family Medicine

## 2015-11-01 DIAGNOSIS — Z85820 Personal history of malignant melanoma of skin: Secondary | ICD-10-CM | POA: Diagnosis not present

## 2015-11-01 DIAGNOSIS — S01402D Unspecified open wound of left cheek and temporomandibular area, subsequent encounter: Secondary | ICD-10-CM | POA: Diagnosis not present

## 2015-11-01 DIAGNOSIS — C61 Malignant neoplasm of prostate: Secondary | ICD-10-CM | POA: Diagnosis not present

## 2015-11-02 ENCOUNTER — Other Ambulatory Visit: Payer: Self-pay | Admitting: Plastic Surgery

## 2015-11-02 ENCOUNTER — Encounter (HOSPITAL_COMMUNITY): Payer: Self-pay | Admitting: *Deleted

## 2015-11-02 DIAGNOSIS — M952 Other acquired deformity of head: Secondary | ICD-10-CM

## 2015-11-02 MED ORDER — CIPROFLOXACIN IN D5W 400 MG/200ML IV SOLN
400.0000 mg | INTRAVENOUS | Status: AC
  Administered 2015-11-03: 400 mg via INTRAVENOUS
  Filled 2015-11-02: qty 200

## 2015-11-03 ENCOUNTER — Ambulatory Visit (HOSPITAL_COMMUNITY): Payer: PPO | Admitting: Anesthesiology

## 2015-11-03 ENCOUNTER — Ambulatory Visit (HOSPITAL_COMMUNITY)
Admission: RE | Admit: 2015-11-03 | Discharge: 2015-11-03 | Disposition: A | Discharge status: Home / Self Care | Payer: PPO | Source: Ambulatory Visit | Attending: Plastic Surgery | Admitting: Plastic Surgery

## 2015-11-03 ENCOUNTER — Encounter (HOSPITAL_COMMUNITY): Payer: Self-pay | Admitting: *Deleted

## 2015-11-03 ENCOUNTER — Encounter (HOSPITAL_COMMUNITY)
Admission: RE | Disposition: A | Discharge status: Home / Self Care | Payer: Self-pay | Source: Ambulatory Visit | Attending: Plastic Surgery

## 2015-11-03 DIAGNOSIS — M952 Other acquired deformity of head: Secondary | ICD-10-CM | POA: Insufficient documentation

## 2015-11-03 DIAGNOSIS — C439 Malignant melanoma of skin, unspecified: Secondary | ICD-10-CM

## 2015-11-03 DIAGNOSIS — I4892 Unspecified atrial flutter: Secondary | ICD-10-CM | POA: Insufficient documentation

## 2015-11-03 DIAGNOSIS — Z85820 Personal history of malignant melanoma of skin: Secondary | ICD-10-CM | POA: Diagnosis not present

## 2015-11-03 DIAGNOSIS — C4339 Malignant melanoma of other parts of face: Secondary | ICD-10-CM | POA: Diagnosis not present

## 2015-11-03 DIAGNOSIS — Z87891 Personal history of nicotine dependence: Secondary | ICD-10-CM | POA: Diagnosis not present

## 2015-11-03 DIAGNOSIS — M109 Gout, unspecified: Secondary | ICD-10-CM | POA: Insufficient documentation

## 2015-11-03 DIAGNOSIS — Z79899 Other long term (current) drug therapy: Secondary | ICD-10-CM | POA: Diagnosis not present

## 2015-11-03 DIAGNOSIS — C61 Malignant neoplasm of prostate: Secondary | ICD-10-CM | POA: Insufficient documentation

## 2015-11-03 DIAGNOSIS — I10 Essential (primary) hypertension: Secondary | ICD-10-CM | POA: Diagnosis not present

## 2015-11-03 HISTORY — DX: Gastrointestinal hemorrhage, unspecified: K92.2

## 2015-11-03 HISTORY — PX: SKIN DEBRIDEMENT: SHX5235

## 2015-11-03 HISTORY — DX: Unspecified dementia, unspecified severity, without behavioral disturbance, psychotic disturbance, mood disturbance, and anxiety: F03.90

## 2015-11-03 LAB — BASIC METABOLIC PANEL
ANION GAP: 8 (ref 5–15)
BUN: 35 mg/dL — AB (ref 6–20)
CO2: 26 mmol/L (ref 22–32)
Calcium: 9.3 mg/dL (ref 8.9–10.3)
Chloride: 100 mmol/L — ABNORMAL LOW (ref 101–111)
Creatinine, Ser: 2.52 mg/dL — ABNORMAL HIGH (ref 0.61–1.24)
GFR, EST AFRICAN AMERICAN: 25 mL/min — AB (ref >60)
GFR, EST NON AFRICAN AMERICAN: 22 mL/min — AB (ref >60)
Glucose, Bld: 108 mg/dL — ABNORMAL HIGH (ref 65–99)
POTASSIUM: 3.9 mmol/L (ref 3.5–5.1)
SODIUM: 134 mmol/L — AB (ref 135–145)

## 2015-11-03 LAB — CBC
HEMATOCRIT: 44.2 % (ref 39.0–52.0)
HEMOGLOBIN: 14 g/dL (ref 13.0–17.0)
MCH: 32.5 pg (ref 26.0–34.0)
MCHC: 31.7 g/dL (ref 30.0–36.0)
MCV: 102.6 fL — ABNORMAL HIGH (ref 78.0–100.0)
Platelets: 189 10*3/uL (ref 150–400)
RBC: 4.31 MIL/uL (ref 4.22–5.81)
RDW: 13.1 % (ref 11.5–15.5)
WBC: 5.4 10*3/uL (ref 4.0–10.5)

## 2015-11-03 SURGERY — DEBRIDEMENT, SKIN, PARTIAL-THICKNESS
Anesthesia: General | Site: Face | Laterality: Left

## 2015-11-03 MED ORDER — GLYCOPYRROLATE 0.2 MG/ML IJ SOLN
INTRAMUSCULAR | Status: DC | PRN
  Administered 2015-11-03: 0.2 mg via INTRAVENOUS
  Administered 2015-11-03: 0.4 mg via INTRAVENOUS

## 2015-11-03 MED ORDER — ONDANSETRON HCL 4 MG/2ML IJ SOLN: 4.0000 mg | Freq: Once | INTRAMUSCULAR | Status: DC | PRN

## 2015-11-03 MED ORDER — FENTANYL CITRATE (PF) 100 MCG/2ML IJ SOLN
INTRAMUSCULAR | Status: AC
  Filled 2015-11-03: qty 2

## 2015-11-03 MED ORDER — LIDOCAINE HCL (CARDIAC) 20 MG/ML IV SOLN
INTRAVENOUS | Status: DC | PRN
  Administered 2015-11-03: 40 mg via INTRAVENOUS

## 2015-11-03 MED ORDER — SODIUM CHLORIDE 0.9 % IR SOLN
Status: DC | PRN
  Administered 2015-11-03: 1000 mL

## 2015-11-03 MED ORDER — PROPOFOL 10 MG/ML IV BOLUS
INTRAVENOUS | Status: AC
  Filled 2015-11-03: qty 20

## 2015-11-03 MED ORDER — NEOSTIGMINE METHYLSULFATE 10 MG/10ML IV SOLN
INTRAVENOUS | Status: DC | PRN
  Administered 2015-11-03: 2 mg via INTRAVENOUS

## 2015-11-03 MED ORDER — SUCCINYLCHOLINE CHLORIDE 20 MG/ML IJ SOLN
INTRAMUSCULAR | Status: DC | PRN
  Administered 2015-11-03: 80 mg via INTRAVENOUS

## 2015-11-03 MED ORDER — FENTANYL CITRATE (PF) 100 MCG/2ML IJ SOLN
INTRAMUSCULAR | Status: DC | PRN
Start: 2015-11-03 — End: 2015-11-03
  Administered 2015-11-03 (×3): 50 ug via INTRAVENOUS

## 2015-11-03 MED ORDER — ROCURONIUM BROMIDE 100 MG/10ML IV SOLN
INTRAVENOUS | Status: DC | PRN
  Administered 2015-11-03: 20 mg via INTRAVENOUS

## 2015-11-03 MED ORDER — FENTANYL CITRATE (PF) 100 MCG/2ML IJ SOLN
25.0000 ug | INTRAMUSCULAR | Status: DC | PRN
  Administered 2015-11-03: 25 ug via INTRAVENOUS

## 2015-11-03 MED ORDER — BUPIVACAINE-EPINEPHRINE (PF) 0.25% -1:200000 IJ SOLN
INTRAMUSCULAR | Status: AC
  Filled 2015-11-03: qty 30

## 2015-11-03 MED ORDER — PROPOFOL 10 MG/ML IV BOLUS
INTRAVENOUS | Status: DC | PRN
  Administered 2015-11-03: 100 mg via INTRAVENOUS
  Administered 2015-11-03: 50 mg via INTRAVENOUS

## 2015-11-03 MED ORDER — CHLORHEXIDINE GLUCONATE CLOTH 2 % EX PADS: 6.0000 | MEDICATED_PAD | Freq: Once | CUTANEOUS | Status: DC

## 2015-11-03 MED ORDER — ONDANSETRON HCL 4 MG/2ML IJ SOLN
INTRAMUSCULAR | Status: DC | PRN
  Administered 2015-11-03: 4 mg via INTRAVENOUS

## 2015-11-03 MED ORDER — CHLORHEXIDINE GLUCONATE CLOTH 2 % EX PADS
6.0000 | MEDICATED_PAD | Freq: Once | CUTANEOUS | Status: DC
Start: 2015-11-03 — End: 2015-11-03

## 2015-11-03 MED ORDER — HYDROCODONE-ACETAMINOPHEN 7.5-325 MG PO TABS: 1.0000 | ORAL_TABLET | Freq: Once | ORAL | Status: DC | PRN

## 2015-11-03 MED ORDER — ONDANSETRON HCL 4 MG/2ML IJ SOLN
INTRAMUSCULAR | Status: AC
  Filled 2015-11-03: qty 2

## 2015-11-03 MED ORDER — FENTANYL CITRATE (PF) 250 MCG/5ML IJ SOLN
INTRAMUSCULAR | Status: AC
  Filled 2015-11-03: qty 5

## 2015-11-03 MED ORDER — LACTATED RINGERS IV SOLN
INTRAVENOUS | Status: DC
  Administered 2015-11-03: 13:00:00 via INTRAVENOUS

## 2015-11-03 MED ORDER — PHENYLEPHRINE HCL 10 MG/ML IJ SOLN
INTRAMUSCULAR | Status: DC | PRN
  Administered 2015-11-03 (×2): 80 ug via INTRAVENOUS
  Administered 2015-11-03 (×3): 40 ug via INTRAVENOUS
  Administered 2015-11-03: 80 ug via INTRAVENOUS
  Administered 2015-11-03 (×2): 40 ug via INTRAVENOUS
  Administered 2015-11-03 (×2): 80 ug via INTRAVENOUS

## 2015-11-03 MED ORDER — BUPIVACAINE-EPINEPHRINE 0.25% -1:200000 IJ SOLN
INTRAMUSCULAR | Status: DC | PRN
  Administered 2015-11-03: 20 mL

## 2015-11-03 SURGICAL SUPPLY — 44 items
ADH SKN CLS APL DERMABOND .7 (GAUZE/BANDAGES/DRESSINGS) ×1
BALL CTTN LRG ABS STRL LF (GAUZE/BANDAGES/DRESSINGS) ×2
BLADE 10 SAFETY STRL DISP (BLADE) ×3 IMPLANT
CANISTER SUCTION 2500CC (MISCELLANEOUS) ×3 IMPLANT
CLOSURE WOUND 1/2 X4 (GAUZE/BANDAGES/DRESSINGS) ×1
COTTONBALL LRG STERILE PKG (GAUZE/BANDAGES/DRESSINGS) ×4 IMPLANT
COVER SURGICAL LIGHT HANDLE (MISCELLANEOUS) ×3 IMPLANT
DERMABOND ADVANCED (GAUZE/BANDAGES/DRESSINGS) ×2
DERMABOND ADVANCED .7 DNX12 (GAUZE/BANDAGES/DRESSINGS) IMPLANT
DRAPE ORTHO SPLIT 77X108 STRL (DRAPES) ×3
DRAPE PROXIMA HALF (DRAPES) ×4 IMPLANT
DRAPE SURG ORHT 6 SPLT 77X108 (DRAPES) IMPLANT
DRSG OPSITE 6X11 MED (GAUZE/BANDAGES/DRESSINGS) IMPLANT
DRSG TEGADERM 4X4.75 (GAUZE/BANDAGES/DRESSINGS) ×2 IMPLANT
ELECT REM PT RETURN 9FT ADLT (ELECTROSURGICAL) ×3
ELECTRODE REM PT RTRN 9FT ADLT (ELECTROSURGICAL) ×1 IMPLANT
GAUZE SPONGE 4X4 12PLY STRL (GAUZE/BANDAGES/DRESSINGS) ×2 IMPLANT
GAUZE XEROFORM 5X9 LF (GAUZE/BANDAGES/DRESSINGS) ×2 IMPLANT
GLOVE BIO SURGEON STRL SZ 6.5 (GLOVE) ×3 IMPLANT
GLOVE BIO SURGEON STRL SZ7 (GLOVE) ×2 IMPLANT
GLOVE BIO SURGEONS STRL SZ 6.5 (GLOVE) ×2
GLOVE SURG SS PI 6.5 STRL IVOR (GLOVE) ×2 IMPLANT
GOWN STRL REUS W/ TWL LRG LVL3 (GOWN DISPOSABLE) ×2 IMPLANT
GOWN STRL REUS W/TWL LRG LVL3 (GOWN DISPOSABLE) ×6
KIT BASIN OR (CUSTOM PROCEDURE TRAY) ×3 IMPLANT
KIT ROOM TURNOVER OR (KITS) ×3 IMPLANT
NDL HYPO 25GX1X1/2 BEV (NEEDLE) IMPLANT
NEEDLE HYPO 25GX1X1/2 BEV (NEEDLE) ×3 IMPLANT
NS IRRIG 1000ML POUR BTL (IV SOLUTION) ×3 IMPLANT
PACK GENERAL/GYN (CUSTOM PROCEDURE TRAY) ×3 IMPLANT
PAD ARMBOARD 7.5X6 YLW CONV (MISCELLANEOUS) ×6 IMPLANT
SOL PREP POV-IOD 4OZ 10% (MISCELLANEOUS) ×2 IMPLANT
STRIP CLOSURE SKIN 1/2X4 (GAUZE/BANDAGES/DRESSINGS) ×1 IMPLANT
SUT MNCRL AB 3-0 PS2 18 (SUTURE) ×2 IMPLANT
SUT MON AB 4-0 RB1 27 (SUTURE) ×2 IMPLANT
SUT MON AB 5-0 P3 18 (SUTURE) ×2 IMPLANT
SUT MON AB 5-0 PS2 18 (SUTURE) ×2 IMPLANT
SUT VIC AB 4-0 PS2 27 (SUTURE) ×8 IMPLANT
SUT VIC AB 5-0 P-3 18XBRD (SUTURE) IMPLANT
SUT VIC AB 5-0 P3 18 (SUTURE) ×3
SYR CONTROL 10ML LL (SYRINGE) ×2 IMPLANT
TOWEL OR 17X24 6PK STRL BLUE (TOWEL DISPOSABLE) ×3 IMPLANT
TOWEL OR 17X26 10 PK STRL BLUE (TOWEL DISPOSABLE) ×3 IMPLANT
UNDERPAD 30X30 INCONTINENT (UNDERPADS AND DIAPERS) ×5 IMPLANT

## 2015-11-03 NOTE — Discharge Instructions (Signed)
Keep dry.  Leave dressing in place Follow up next Friday.

## 2015-11-03 NOTE — Op Note (Signed)
Operative Note   DATE OF OPERATION: 11/03/2015  LOCATION: [facility]   SURGICAL DIVISION: Plastic Surgery  PREOPERATIVE DIAGNOSES:  Mohs defect after melanoma resection 6 x 8 cm  POSTOPERATIVE DIAGNOSES:  same  PROCEDURE:  Tissue advancement of 2 x 2 cm, full thickness skin graft to 4 x 6 cm defect  SURGEON: Corlette Ciano Sanger Krish Bailly, DO  ANESTHESIA:  General.   COMPLICATIONS: None.   INDICATIONS FOR PROCEDURE:  The patient, Anthony Sparks is a 82 y.o. male born on 10-29-1927, is here for treatment of a mohs resection for melanoma of the left cheek. MRN: [id number]  CONSENT:  Informed consent was obtained directly from the patient. Risks, benefits and alternatives were fully discussed. Specific risks including but not limited to bleeding, infection, hematoma, seroma, scarring, pain, infection, contracture, asymmetry, wound healing problems, and need for further surgery were all discussed. The patient did have an ample opportunity to have questions answered to satisfaction.   DESCRIPTION OF PROCEDURE:  The patient was taken to the operating room. SCDs were placed and IV antibiotics were given. The patient's operative site was prepped and draped in a sterile fashion. A time out was performed and all information was confirmed to be correct.  General anesthesia was administered.  Local was injected at the left cheek for intraoperative hemostasis and post operative pain relief.  The area was 6 x 8 cm and the clavicle area was marked and injected with the local.  The cheek was undermined at the face lift level.  The skin was freed and pulled posteriorly for 2 x 4 cm.  The resulting wound was 4 x 6 cm for the area need for a skin graft.  The excess skin at the lobule was excised and closed primarily with 5-0 Monocryl.  Hemostasis was achieved with electrocautery.  The skin graft was obtained from the left clavicle skin.  Undermining was done for 2 cm to decrease tension for  closure.  The 3-0 Monocryl was used to close the deep layers.  The 4-0 Monocryl was used to close the skin at the subcutaneous layer.  Dermabond was applied with steri strips.  A sterile dressing was applied.  The full thickness skin graft was cleaned of the fat.  The graft 4 x 6 cm was applied to the defect and secured with 4-0 Vicryl for the bolsters.  The 5-0 Vicryl was used to secure the edges.  The bolster was applied with a xeroform.   The patient tolerated the procedure well.  There were no complications. The patient was allowed to wake from anesthesia, extubated and taken to the recovery room in satisfactory condition.

## 2015-11-03 NOTE — Brief Op Note (Signed)
11/03/2015  4:24 PM  PATIENT:  Anthony Sparks  82 y.o. male  PRE-OPERATIVE DIAGNOSIS:  LEFT CHEEK MELANOMA  POST-OPERATIVE DIAGNOSIS:  LEFT CHEEK MELANOMA  PROCEDURE:  Procedure(s):  SKIN GRAFT TO LEFT CHEEK (Left)  SURGEON:  Surgeon(s) and Role:    * Cj Beecher S Yulitza Shorts, DO - Primary  PHYSICIAN ASSISTANT: none  ASSISTANTS: none   ANESTHESIA:   general  EBL:  Total I/O In: -  Out: 10 [Blood:10]  BLOOD ADMINISTERED:none  DRAINS: none   LOCAL MEDICATIONS USED:  MARCAINE     SPECIMEN:  No Specimen  DISPOSITION OF SPECIMEN:  N/A  COUNTS:  YES  TOURNIQUET:  * No tourniquets in log *  DICTATION: .Dragon Dictation  PLAN OF CARE: Discharge to home after PACU  PATIENT DISPOSITION:  PACU - hemodynamically stable.   Delay start of Pharmacological VTE agent (>24hrs) due to surgical blood loss or risk of bleeding: no

## 2015-11-03 NOTE — Anesthesia Preprocedure Evaluation (Addendum)
Anesthesia Evaluation  Patient identified by MRN, date of birth, ID band Patient awake    Reviewed: Allergy & Precautions, H&P , NPO status , Patient's Chart, lab work & pertinent test results  History of Anesthesia Complications Negative for: history of anesthetic complications  Airway Mallampati: III  TM Distance: >3 FB Neck ROM: full    Dental  (+) Poor Dentition   Pulmonary former smoker,    Pulmonary exam normal breath sounds clear to auscultation       Cardiovascular hypertension, Pt. on medications Normal cardiovascular exam+ dysrhythmias Atrial Fibrillation  Rhythm:regular Rate:Normal     Neuro/Psych PSYCHIATRIC DISORDERS Depression negative neurological ROS     GI/Hepatic Neg liver ROS, Hx of GI bleed requiring hospitalization   Endo/Other  negative endocrine ROS  Renal/GU CRFRenal disease     Musculoskeletal   Abdominal   Peds  Hematology negative hematology ROS (+)   Anesthesia Other Findings Moderate dementia  Poor family history of medical conditions, he has known history of aflutter, in atrial flutter today with HR of 70, he has previous EKGs showing this as well with a previous work up and Echo from 2012 with normal EF of 60% and no significant valve abnormalities.. He denies new onset of chest pain, shortness of breath, syncope or worsening pulmonary status.   Reproductive/Obstetrics negative OB ROS                          Anesthesia Physical Anesthesia Plan  ASA: III  Anesthesia Plan: General   Post-op Pain Management:    Induction: Intravenous  Airway Management Planned: LMA  Additional Equipment:   Intra-op Plan:   Post-operative Plan: Extubation in OR  Informed Consent: I have reviewed the patients History and Physical, chart, labs and discussed the procedure including the risks, benefits and alternatives for the proposed anesthesia with the patient or  authorized representative who has indicated his/her understanding and acceptance.   Dental Advisory Given  Plan Discussed with: Anesthesiologist, CRNA and Surgeon  Anesthesia Plan Comments:         Anesthesia Quick Evaluation

## 2015-11-03 NOTE — Anesthesia Postprocedure Evaluation (Addendum)
Anesthesia Post Note  Patient: Anthony Sparks  Procedure(s) Performed: Procedure(s) (LRB):  SKIN GRAFT TO LEFT CHEEK (Left)  Patient location during evaluation: PACU Anesthesia Type: General Level of consciousness: awake and alert Pain management: pain level controlled Vital Signs Assessment: post-procedure vital signs reviewed and stable Respiratory status: spontaneous breathing, nonlabored ventilation, respiratory function stable and patient connected to nasal cannula oxygen Cardiovascular status: blood pressure returned to baseline and stable Postop Assessment: no signs of nausea or vomiting Anesthetic complications: no Comments: Looking great post op, Hr 66, blood pressure is baseline, denies chest pain or shortness of breath    Last Vitals:  Filed Vitals:   11/03/15 1417 11/03/15 1637  BP: 137/74   Pulse: 71   Temp: 36.8 C 36.4 C  Resp: 20     Last Pain:  Filed Vitals:   11/03/15 1652  PainSc: Asleep                 Zenaida Deed

## 2015-11-03 NOTE — Anesthesia Procedure Notes (Signed)
Procedure Name: Intubation Date/Time: 11/03/2015 3:12 PM Performed by: Ignacia Bayley Pre-anesthesia Checklist: Patient identified, Emergency Drugs available, Suction available and Patient being monitored Patient Re-evaluated:Patient Re-evaluated prior to inductionOxygen Delivery Method: Circle system utilized Preoxygenation: Pre-oxygenation with 100% oxygen Intubation Type: IV induction Ventilation: Mask ventilation without difficulty Laryngoscope Size: Miller and 2 Grade View: Grade II Tube type: Oral Tube size: 7.5 mm Number of attempts: 1 Airway Equipment and Method: Stylet Placement Confirmation: ETT inserted through vocal cords under direct vision,  positive ETCO2 and breath sounds checked- equal and bilateral Secured at: 23 cm Tube secured with: Tape Dental Injury: Teeth and Oropharynx as per pre-operative assessment

## 2015-11-03 NOTE — H&P (Signed)
Anthony Sparks is an 82 y.o. male.   Chief Complaint: mohs defect from melanoma resection HPI: The patient is a 82 y.o. yrs old wm here with his wife and son for follow up of his left cheek. He was diagnosed with melanoma of the left cheek. He underwent resection last week and now has negative margins. He had cataract surgery in the past and was recently diagnosed with prostate cancer. He lives at home with his wife and has good family support. The area is quite large and includes almost half his right cheek. The SMAS seems to be intact. There is full function of the muscles and facial animation appears equal. The mohs resected area is ~ 6 x 8 cm in front of his left ear. Donated Acell was applied to the area on 10/29/15 and he returns today to have more Acell applied prior to planned surgery later this week for closure with graft. He had copious drainage from the dressing from Friday and I am not sure how long the dressing actually stayed in place. He is complaining of severe pain over the area including inside the external canal of the ear and requests something stronger than Tylenol for pain.   Past Medical History  Diagnosis Date  . Hypertension   . Blood transfusion without reported diagnosis   . Prostate cancer ([place]) 2012  . Gout   . Macular degeneration of both eyes   . Kidney disease     born with one kidney  . GI bleed   . Dementia     Moderate per Dr Tish Frederickson- patient and family deny    Past Surgical History  Procedure Laterality Date  . Esophagogastroduodenoscopy N/A 09/19/2014    Procedure: ESOPHAGOGASTRODUODENOSCOPY (EGD);  Surgeon: [name], MD;  Location: [facility]COPY;  Service: Endoscopy;  Laterality: N/A;  . Cataract surgery Bilateral     6-7 yrs ago  . Cardiac catheterization      Cataract    Family History  Problem Relation Age of Onset  . Family history unknown: Yes   Social History:  reports that he quit smoking about 36 years ago. His smoking use included  Cigarettes. He has never used smokeless tobacco. He reports that he drinks about 4.2 oz of alcohol per week. He reports that he does not use illicit drugs.  Allergies:  Allergies  Allergen Reactions  . Sulfa Antibiotics Hives, Itching and Rash  . Sulfamethoxazole Hives, Itching and Rash  . Augmentin [Amoxicillin-Pot Clavulanate] Nausea Only and Other (See Comments)    Stomach pain Has patient had a PCN reaction causing immediate rash, facial/tongue/throat swelling, SOB or lightheadedness with hypotension: no Has patient had a PCN reaction causing severe rash involving mucus membranes or skin necrosis: no Has patient had a PCN reaction that required hospitalization no Has patient had a PCN reaction occurring within the last 10 years: no If all of the above answers are "NO", then may proceed with Cephalos    Medications Prior to Admission  Medication Sig Dispense Refill  . allopurinol (ZYLOPRIM) 100 MG tablet Take 100 mg by mouth daily.    . furosemide (LASIX) 80 MG tablet Take 1 tablet (80 mg total) by mouth 2 (two) times daily. (Patient taking differently: Take 80 mg by mouth daily. ) 60 tablet 3  . HYDROcodone-acetaminophen (NORCO/VICODIN) 5-325 MG tablet Take 1 tablet by mouth every 6 (six) hours as needed for moderate pain (.5- 1 tablet).    Marland Kitchen ibuprofen (ADVIL,MOTRIN) 200 MG tablet Take 600 mg  by mouth every 6 (six) hours as needed (For pain.).     Marland Kitchen NIFEdipine (PROCARDIA-XL/ADALAT-CC/NIFEDICAL-XL) 30 MG 24 hr tablet Take 1 tablet (30 mg total) by mouth daily. 30 tablet 11  . pantoprazole (PROTONIX) 40 MG tablet Take 1 tablet (40 mg total) by mouth daily. 30 tablet 11  . NIFEdipine (PROCARDIA-XL/ADALAT-CC/NIFEDICAL-XL) 30 MG 24 hr tablet TAKE 1 TABLET BY MOUTH ONCE A DAY 30 tablet 11    Results for orders placed or performed during the hospital encounter of 11/03/15 (from the past 48 hour(s))  Basic metabolic panel     Status: Abnormal   Collection Time: 11/03/15 12:45 PM  Result  Value Ref Range   Sodium 134 (L) 135 - 145 mmol/L   Potassium 3.9 3.5 - 5.1 mmol/L   Chloride 100 (L) 101 - 111 mmol/L   CO2 26 22 - 32 mmol/L   Glucose, Bld 108 (H) 65 - 99 mg/dL   BUN 35 (H) 6 - 20 mg/dL   Creatinine, Ser 2.52 (H) 0.61 - 1.24 mg/dL   Calcium 9.3 8.9 - 10.3 mg/dL   GFR calc non Af Amer 22 (L) >60 mL/min   GFR calc Af Amer 25 (L) >60 mL/min    Comment: (NOTE) The eGFR has been calculated using the CKD EPI equation. This calculation has not been validated in all clinical situations. eGFR's persistently <60 mL/min signify possible Chronic Kidney Disease.    Anion gap 8 5 - 15  CBC     Status: Abnormal   Collection Time: 11/03/15 12:45 PM  Result Value Ref Range   WBC 5.4 4.0 - 10.5 K/uL   RBC 4.31 4.22 - 5.81 MIL/uL   Hemoglobin 14.0 13.0 - 17.0 g/dL   HCT 44.2 39.0 - 52.0 %   MCV 102.6 (H) 78.0 - 100.0 fL   MCH 32.5 26.0 - 34.0 pg   MCHC 31.7 30.0 - 36.0 g/dL   RDW 13.1 11.5 - 15.5 %   Platelets 189 150 - 400 K/uL   No results found.  Review of Systems  Constitutional: Negative.   HENT: Negative.   Eyes: Negative.   Respiratory: Negative.   Cardiovascular: Negative.   Gastrointestinal: Negative.   Genitourinary: Negative.   Musculoskeletal: Negative.   Skin: Negative.   Neurological: Negative.   Psychiatric/Behavioral: Negative.     Blood pressure 137/74, pulse 71, temperature 98.2 F (36.8 C), resp. rate 20, height _0  (1.702 m), weight 81.647 kg (180 lb), SpO2 100 %. Physical Exam  Constitutional: He appears well-developed and well-nourished.  HENT:  Head: Normocephalic.    Eyes: EOM are normal. Pupils are equal, round, and reactive to light.  Respiratory: Effort normal.  GI: Soft.  Neurological: He is alert.  Skin: Skin is warm.  Psychiatric: He has a normal mood and affect. His behavior is normal. Judgment and thought content normal.     Assessment/Plan Plan for partial closure and full thickness skin graft placement to left cheek  for repair of mohs defect from melanoma resection.  Wallace Going, DO 11/03/2015, 2:29 PM

## 2015-11-03 NOTE — Transfer of Care (Signed)
Immediate Anesthesia Transfer of Care Note  Patient: Anthony Sparks  Procedure(s) Performed: Procedure(s):  SKIN GRAFT TO LEFT CHEEK (Left)  Patient Location: PACU  Anesthesia Type:General  Level of Consciousness: awake  Airway & Oxygen Therapy: Patient Spontanous Breathing and Patient connected to nasal cannula oxygen  Post-op Assessment: Report given to RN and Post -op Vital signs reviewed and stable  Post vital signs: stable  Last Vitals:  Filed Vitals:   11/03/15 1417  BP: 137/74  Pulse: 71  Temp: 36.8 C  Resp: 20    Last Pain:  Filed Vitals:   11/03/15 1638  PainSc: 5       Patients Stated Pain Goal: 3 ([id number])  Complications: No apparent anesthesia complications

## 2015-11-04 ENCOUNTER — Encounter (HOSPITAL_COMMUNITY): Payer: Self-pay | Admitting: Plastic Surgery

## 2015-11-11 ENCOUNTER — Encounter: Payer: Self-pay | Admitting: Family Medicine

## 2015-11-11 DIAGNOSIS — C439 Malignant melanoma of skin, unspecified: Secondary | ICD-10-CM | POA: Insufficient documentation

## 2015-11-29 ENCOUNTER — Telehealth: Payer: Self-pay | Admitting: Family Medicine

## 2015-11-29 NOTE — Telephone Encounter (Signed)
Pt's wife states that his heart rate [facility] last week was 136, then 116 when the doctor checked it again. They advised him to let Dr. Dennard Schaumann know. [name]

## 2015-11-30 NOTE — Telephone Encounter (Signed)
Ntbs, may have been anxious but want to follow up.

## 2015-11-30 NOTE — Telephone Encounter (Signed)
LMTRC

## 2015-12-03 ENCOUNTER — Encounter: Payer: Self-pay | Admitting: Family Medicine

## 2015-12-03 ENCOUNTER — Ambulatory Visit (INDEPENDENT_AMBULATORY_CARE_PROVIDER_SITE_OTHER): Payer: PPO | Admitting: Family Medicine

## 2015-12-03 VITALS — BP 110/68 | HR 96 | Temp 98.1°F | Resp 16 | Wt 182.0 lb

## 2015-12-03 DIAGNOSIS — I483 Typical atrial flutter: Secondary | ICD-10-CM | POA: Diagnosis not present

## 2015-12-03 DIAGNOSIS — I499 Cardiac arrhythmia, unspecified: Secondary | ICD-10-CM | POA: Diagnosis not present

## 2015-12-03 MED ORDER — METOPROLOL SUCCINATE ER 25 MG PO TB24: 25.0000 mg | ORAL_TABLET | Freq: Every day | ORAL | Status: DC

## 2015-12-03 NOTE — Telephone Encounter (Signed)
Seen in ov today 

## 2015-12-03 NOTE — Progress Notes (Signed)
Subjective:    Patient ID: Anthony Sparks, male    DOB: [birth year], 82 y.o.   MRN: [id number]  HPI  10/2014 Patient is here today to establish care. He was recently admitted to the hospital with an upper GI bleed. EGD revealed gastric ulcers and duodenal ulcers attributed to long-standing NSAID use. He was treated with a protonix  IV drip.   He was supposed to be transitioned to protonix  Twice a day. However at the present time he is not taking a proton pump inhibitor. He denies any melanotic or hematochezia. He is accompanied today by his wife. The patient does not speak very much at all. His wife states that he sits in the room by himself. He simply sits in a chair. He does not watch TV. He is not active. I asked the patient if he feels depressed and he says no. However I ask him what makes him happy and he is unable to give me an answer. He seems very confused and unable to answer my questions. Therefore I performed a Mini-Mental status exam. A shunt is unable to tell me the month the day of the year. He is able to tell me the location. He is unable to perform serial sevens. He is unable to spell world in reverse. He is able to remember 1 out of 3 objects on rapid recall. When asked the patient to draw the face of a clock he is unable to put the hands of the clock in the appropriate position nor is he able to show the correct time. Furthermore he is only able to write 2 numbers and they're located in the wrong area.   I am concerned that the patient has moderate to severe dementia and that he sits quietly at home and since he does not talk very much,  It has gone unnoticed. He also has a history of chronic kidney disease for which he takes Lasix 80 mg by mouth daily for swelling. On examination today his legs show chronic venous stasis changes and venous stasis dermatitis. There is no pitting edema.At that time, my plan was:   I am very concerned by the fact the patient is not on a PPI given his  recent upper GI bleed. Therefore I recommended that they start Protonix 40 mg by mouth twice a day for at least 3 months and then once daily thereafter.   Patient certainly may be battling depression but I'm concerned he has moderate to severe dementia. Therefore I will check a CBC, CMP, TSH, vitamin B12, and a methylmalonic acid level. If underlying causes her not determined , I would like to start the patient on Aricept and Namenda. However the patient's wife seems very hesitant to accept the diagnosis that the patient may have dementia and therefore I would like to schedule him to see a neurologist for a second opinion. Patient received Pneumovax 23 today in clinic. Given his age and his frailty I would not recommend a colonoscopy. He has a history of prostate cancer but he refuses any treatment and therefore I do not see the utility in checking a PSA.  09/28/15 Patient has not been seen since. He did see neurology who also felt that he has dementia however the patient will not take any medication for memory loss. His blood pressure is extremely elevated today. He has been off nifedipine for 3 days. However his blood pressure seems drastically elevated even have been off nifedipine. He is also had a 3  day history of productive cough. On examination today, he has left basilar Rales with faint expiratory wheezing consistent with bronchitis or walking pneumonia. He denies any fevers or chills or hemoptysis. He has independently stopped taking his allopurinol and his pantoprazole at home because he does not like to take pills and I believe in part due to confusion with dementia. However, he states he is compliant with his blood pressure medication. He has a large nevus on his left cheek. At the superior left portion/11:00, there is a very irregular atypical dark patch within the nevus concerning for atypia/possible melanoma. I recommended a dermatologist consultation but he declined.  At that time, my plan  was: Begin a Z-Pak. Recheck his lungs in one week. Should his breathing worsen I want him to return immediately. Resume nifedipine immediately and recheck blood pressure in one week. I believe his blood pressure is drastically elevated even to have not been on nifedipine. He may require more medicine for his blood pressure. I will check fasting lab work today. Patient does not want to see a dermatologist and therefore I suggested that we perform a biopsy of the atypical region in the large nevus on his left cheek.  He consents to this and will allow me to perform this next week when he is feeling better. I strongly recommended that he resume pantoprazole 40 mg once a day.  10/05/15 Fortunately, the patient's blood pressure is much better today at 132/86. He has been compliant taking his medication. He is not taking his Lasix however and he has significant swelling in both legs. Unfortunately the patient is noncompliant and I believe some of that can be due to his dementia. His cough is much better after having completed the antibiotic. He has a slight residual cough but it is markedly and better. He is here today for the biopsy of the lesion on his left temple/left cheek just anterior to his ear. There is a large brown nevus approximately the size of a golf ball in that location. The left superior quadrant however has dark black discoloration in that area with irregular borders concerning for possible melanoma.  At that time, my plan was: Blood pressures acceptable. I'll make no changes today and his blood pressure medication. I urged compliance with his Lasix. His cough is much better and his bronchitis seems to resolve. I am very concerned by the nevus on the left side of his face. I anesthetized that area was 0.1% lidocaine and performed a shave biopsy and Compazine approximately 7-8 mm of the atypical area described in the history of present illness. Hemostasis was achieved with Drysol. The lesion was sent  to pathology and labeled container. Of note, the patient has dementia which he declines treatment for. He has a very flat affect. He also demonstrates bradykinesia. Today on his exam for the first time I noticed a pill-rolling tremor in his right hand all which is concerning for Parkinson-like features. I discussed this with the patient and his wife that they declined referral to a neurologist at the present time  12/03/15 Unfortunately, the patient's biopsy was positive for melanoma. He has subsequently underwent excision of the melanoma under the care of a [facility] surgeon.  However I received a call earlier this week stating that he is having a rapid heartbeat. Given his history of GI bleed and his noncompliance with medication, I wanted see the patient back immediately to determine what and why he is having such a fast heart rate.  On  exam the patient today, the patient has an irregularly irregular heart rate with an elevated pulse. I reviewed his preoperative EKG obtained in June and at that time he was found to be in atrial flutter. I do not have previous EKGs to compare at this time. This is a new diagnosis for me. However the patient has a history of a GI bleed making anticoagulation risky. Furthermore he has dementia and a history of medication noncompliance. He does report some shortness of breath. He denies any chest pain. Today at the dermatologist, his heart rate was 136 Past Medical History  Diagnosis Date  . Hypertension   . Blood transfusion without reported diagnosis   . Prostate cancer ([place]) 2012  . Gout   . Macular degeneration of both eyes   . Kidney disease     born with one kidney  . GI bleed   . Dementia     Moderate per Dr Tish Frederickson- patient and family deny  . Malignant melanoma ([place])     [facility]   Past Surgical History  Procedure Laterality Date  . Esophagogastroduodenoscopy N/A 09/19/2014    Procedure: ESOPHAGOGASTRODUODENOSCOPY (EGD);  Surgeon: [name], MD;  Location:  [facility] ENDOSCOPY;  Service: Endoscopy;  Laterality: N/A;  . Cataract surgery Bilateral     6-7 yrs ago  . Cardiac catheterization      Cataract  . Skin debridement Left 11/03/2015    Procedure:  SKIN GRAFT TO LEFT CHEEK;  Surgeon: [name], DO;  Location: [facility];  Service: Plastics;  Laterality: Left;   Current Outpatient Prescriptions on File Prior to Visit  Medication Sig Dispense Refill  . allopurinol (ZYLOPRIM) 100 MG tablet Take 100 mg by mouth daily.    . furosemide (LASIX) 80 MG tablet Take 1 tablet (80 mg total) by mouth 2 (two) times daily. (Patient taking differently: Take 80 mg by mouth daily. ) 60 tablet 3  . HYDROcodone-acetaminophen (NORCO/VICODIN) 5-325 MG tablet Take 1 tablet by mouth every 6 (six) hours as needed for moderate pain (.5- 1 tablet).    Marland Kitchen ibuprofen (ADVIL,MOTRIN) 200 MG tablet Take 600 mg by mouth every 6 (six) hours as needed (For pain.).     Marland Kitchen NIFEdipine (PROCARDIA-XL/ADALAT-CC/NIFEDICAL-XL) 30 MG 24 hr tablet Take 1 tablet (30 mg total) by mouth daily. 30 tablet 11  . NIFEdipine (PROCARDIA-XL/ADALAT-CC/NIFEDICAL-XL) 30 MG 24 hr tablet TAKE 1 TABLET BY MOUTH ONCE A DAY 30 tablet 11  . pantoprazole (PROTONIX) 40 MG tablet Take 1 tablet (40 mg total) by mouth daily. 30 tablet 11   No current facility-administered medications on file prior to visit.   Allergies  Allergen Reactions  . Sulfa Antibiotics Hives, Itching and Rash  . Sulfamethoxazole Hives, Itching and Rash  . Augmentin [Amoxicillin-Pot Clavulanate] Nausea Only and Other (See Comments)    Stomach pain Has patient had a PCN reaction causing immediate rash, facial/tongue/throat swelling, SOB or lightheadedness with hypotension: no Has patient had a PCN reaction causing severe rash involving mucus membranes or skin necrosis: no Has patient had a PCN reaction that required hospitalization no Has patient had a PCN reaction occurring within the last 10 years: no If all of the above answers are  "NO", then may proceed with Cephalos   Social History   Social History  . Marital Status: Married    Spouse Name: betty  . Number of Children: 1  . Years of Education: 8   Occupational History  . retired     Marine scientist  Social History Main Topics  . Smoking status: Former Smoker    Types: Cigarettes    Quit date: 11/20/1978  . Smokeless tobacco: Never Used  . Alcohol Use: 4.2 oz/week    7 Shots of liquor per week     Comment: make be a drink before bed  . Drug Use: No  . Sexual Activity: Not Currently   Other Topics Concern  . Not on file   Social History Narrative   Lives at home with wife    caffeine use - decaff tea   Family History  Problem Relation Age of Onset  . Family history unknown: Yes     Review of Systems  All other systems reviewed and are negative.      Objective:   Physical Exam  Constitutional: He appears well-developed and well-nourished.  Neck: Neck supple. No JVD present. No thyromegaly present.  Cardiovascular: Normal heart sounds.  An irregular rhythm present. Tachycardia present.   No murmur heard. Pulmonary/Chest: Effort normal. No respiratory distress. He has wheezes. He has rales. He exhibits no tenderness.  Abdominal: Soft. Bowel sounds are normal. He exhibits no distension and no mass. There is no tenderness. There is no rebound and no guarding.  Musculoskeletal: Normal range of motion. He exhibits no edema.  Lymphadenopathy:    He has no cervical adenopathy.  Neurological: He is alert. He exhibits normal muscle tone. Coordination normal.  Skin: Rash noted.  Psychiatric: Cognition and memory are impaired.  Vitals reviewed.         Assessment & Plan:  Irregular heartbeat - Plan: EKG 12-Lead  Typical atrial flutter (HCC) - Plan: metoprolol succinate (TOPROL-XL) 25 MG 24 hr tablet, DISCONTINUED: metoprolol succinate (TOPROL-XL) 25 MG 24 hr tablet  EKG reveals atrial flutter with nonspecific ST changes in the lateral leads.  Begin Toprol-XL 25 mg by mouth daily. Given his history of GI bleed and noncompliance I hesitate to put the patient on any anticoagulation. I will compromise of aspirin 81 mg by mouth daily. Consult cards.

## 2015-12-23 ENCOUNTER — Telehealth: Payer: Self-pay | Admitting: Family Medicine

## 2015-12-23 MED ORDER — COLCHICINE 0.6 MG PO TABS: ORAL_TABLET | ORAL | 1 refills | Status: DC

## 2015-12-23 MED ORDER — ALLOPURINOL 100 MG PO TABS: 100.0000 mg | ORAL_TABLET | Freq: Every day | ORAL | 5 refills | Status: AC

## 2015-12-23 NOTE — Telephone Encounter (Signed)
I told patient what Dr. Dennard Schaumann said about the other medication she would like [place] to call her once this has been sent in,.  CB#  [phone]

## 2015-12-23 NOTE — Telephone Encounter (Signed)
Patient aware of providers recommendations. Medication called/sent to requested pharmacy  

## 2015-12-23 NOTE — Telephone Encounter (Signed)
Ok to refill 

## 2015-12-23 NOTE — Telephone Encounter (Signed)
Ok to refill allopurinol but it will NOT treat gout flare.  Needs colchicine 0.6 mg tabs 2  Tabs initially and 1 tab in 1 hour if pain persists.  Allopurinol should be taken daily to prevent gout flares.

## 2015-12-23 NOTE — Telephone Encounter (Signed)
[name] called and states patient has gout in his right wrist she is requesting refill on allopurinol  [facility] on [address]. [place]  Please call her if this can be called in.  CB# [phone]

## 2015-12-30 ENCOUNTER — Telehealth: Payer: Self-pay | Admitting: Family Medicine

## 2015-12-30 NOTE — Telephone Encounter (Signed)
HTA referral for Dr Oval Linsey, Cardiology.  6 visits from 01/11/16 - 07/09/16 for Dx I48.3 Typical Atrial Flutter

## 2016-01-11 ENCOUNTER — Encounter (INDEPENDENT_AMBULATORY_CARE_PROVIDER_SITE_OTHER): Payer: Self-pay

## 2016-01-11 ENCOUNTER — Encounter: Payer: Self-pay | Admitting: Cardiovascular Disease

## 2016-01-11 ENCOUNTER — Ambulatory Visit (INDEPENDENT_AMBULATORY_CARE_PROVIDER_SITE_OTHER): Payer: PPO | Admitting: Cardiovascular Disease

## 2016-01-11 VITALS — BP 118/80 | HR 113 | Ht 66.0 in

## 2016-01-11 DIAGNOSIS — I4892 Unspecified atrial flutter: Secondary | ICD-10-CM | POA: Diagnosis not present

## 2016-01-11 DIAGNOSIS — I483 Typical atrial flutter: Secondary | ICD-10-CM | POA: Diagnosis not present

## 2016-01-11 DIAGNOSIS — I10 Essential (primary) hypertension: Secondary | ICD-10-CM

## 2016-01-11 HISTORY — DX: Essential (primary) hypertension: I10

## 2016-01-11 HISTORY — DX: Unspecified atrial flutter: I48.92

## 2016-01-11 LAB — BASIC METABOLIC PANEL
BUN: 28 mg/dL — AB (ref 7–25)
CHLORIDE: 94 mmol/L — AB (ref 98–110)
CO2: 29 mmol/L (ref 20–31)
CREATININE: 2.12 mg/dL — AB (ref 0.70–1.11)
Calcium: 8.6 mg/dL (ref 8.6–10.3)
GLUCOSE: 96 mg/dL (ref 65–99)
Potassium: 3.7 mmol/L (ref 3.5–5.3)
Sodium: 136 mmol/L (ref 135–146)

## 2016-01-11 MED ORDER — METOPROLOL SUCCINATE ER 50 MG PO TB24: 50.0000 mg | ORAL_TABLET | Freq: Every day | ORAL | 5 refills | Status: DC

## 2016-01-11 NOTE — Patient Instructions (Addendum)
Please make sure to take your lasix twice daily every day. Try to elevate your legs whenever possible. Please wear compression stockings to help with the swelling in your legs.      Atrial Flutter Atrial flutter is a heart rhythm that can cause the heart to beat very fast (tachycardia). It originates in the upper chambers of the heart (atria). In atrial flutter, the top chambers of the heart (atria) often beat much faster than the bottom chambers of the heart (ventricles). Atrial flutter has a regular "saw toothed" appearance in an EKG readout. An EKG is a test that records the electrical activity of the heart. Atrial flutter can cause the heart to beat up to 150 beats per minute (BPM). Atrial flutter can either be short lived (paroxysmal) or permanent.  CAUSES  Causes of atrial flutter can be many. Some of these include:  Heart related issues:  Heart attack (myocardial infarction).  Heart failure.  Heart valve problems.  Poorly controlled high blood pressure (hypertension).  After open heart surgery.  Lung related issues:  A blood clot in the lungs (pulmonary embolism).  Chronic obstructive pulmonary disease (COPD). Medications used to treat COPD can attribute to atrial flutter.  Other related causes:  Hyperthyroidism.  Caffeine.  Some decongestant cold medications.  Low electrolyte levels such as potassium or magnesium.  Cocaine. SYMPTOMS  An awareness of your heart beating rapidly (palpitations).  Shortness of breath.  Chest pain.  Low blood pressure (hypotension).  Dizziness or fainting. DIAGNOSIS  Different tests can be performed to diagnose atrial flutter.   An EKG.  Holter monitor. This is a 24-hour recording of your heart rhythm. You will also be given a diary. Write down all symptoms that you have and what you were doing at the time you experienced symptoms.  Cardiac event monitor. This small device can be worn for up to 30 days. When you have  heart symptoms, you will push a button on the device. This will then record your heart rhythm.  Echocardiogram. This is an imaging test to look at your heart. Your caregiver will look at your heart valves and the ventricles.  Stress test. This test can help determine if the atrial flutter is related to exercise or if coronary artery disease is present.  Laboratory studies will look at certain blood levels like:  Complete blood count (CBC).  Potassium.  Magnesium.  Thyroid function. TREATMENT  Treatment of atrial flutter varies. A combination of therapies may be used or sometimes atrial flutter may need only 1 type of treatment.  Lab work: If your blood work, such as your electrolytes (potassium, magnesium) or your thyroid function tests, are abnormal, your caregiver will treat them accordingly.  Medication:  There are several different types of medications that can convert your heart to a normal rhythm and prevent atrial flutter from reoccurring.  Nonsurgical procedures: Nonsurgical techniques may be used to control atrial flutter. Some examples include:  Cardioversion. This technique uses either drugs or an electrical shock to restore a normal heart rhythm:  Cardioversion drugs may be given through an intravenous (IV) line to help "reset" the heart rhythm.  In electrical cardioversion, your caregiver shocks your heart with electrical energy. This helps to reset the heartbeat to a normal rhythm.  Ablation. If atrial flutter is a persistent problem, an ablation may be needed. This procedure is done under mild sedation. High frequency radio-wave energy is used to destroy the area of heart tissue responsible for atrial flutter. SEEK IMMEDIATE MEDICAL  CARE IF:  You have:  Dizziness.  Near fainting or fainting.  Shortness of breath.  Chest pain or pressure.  Sudden nausea or vomiting.  Profuse sweating. If you have the above symptoms, call your local emergency service  immediately! Do not drive yourself to the hospital. MAKE SURE YOU:   Understand these instructions.  Will watch your condition.  Will get help right away if you are not doing well or get worse.   This information is not intended to replace advice given to you by your health care provider. Make sure you discuss any questions you have with your health care provider.   Document Released: 09/24/2008 Document Revised: 05/29/2014 Document Reviewed: 11/20/2014 Elsevier Interactive Patient Education 2016 [employer].  Medication Instructions:  STOP YOUR NIFEDIPINE  INCREASE YOUR METOPROLOL TO 50 MG DAILY   Labwork: TSH/FT4/BMET AT [facility] ON THE FIRST FLOOR  Testing/Procedures: Your physician has requested that you have an echocardiogram. Echocardiography is a painless test that uses sound waves to create images of your heart. It provides your doctor with information about the size and shape of your heart and how well your heart's chambers and valves are working. This procedure takes approximately one hour. There are no restrictions for this procedure. [facility] at [place] [address]  Follow-Up: Your physician recommends that you schedule a follow-up appointment in: 2 weeks with PA/NP  Any Other Special Instructions Will Be Listed Below (If Applicable).     If you need a refill on your cardiac medications before your next appointment, please call your pharmacy.

## 2016-01-11 NOTE — Progress Notes (Signed)
Cardiology Office Note   Date:  01/11/2016   ID:  Anthony Sparks, DOB 1927/10/28, MRN [id number]  PCP:  Odette Fraction, MD  Cardiologist:   Skeet Latch, MD   Chief Complaint  Patient presents with  . New Patient (Initial Visit)    sharp chest pain. cramping in legs occasionally. edema;in legs, ankles and feet.       History of Present Illness: Anthony Sparks is a 82 y.o. male with hypertension, dementia, melanoma, CKD IV and prior GI bleed who presents for an evaluation of atrial flutter.  Anthony Sparks was hospitalized 4/30 through 5/6 2016 for an upper GI bleed. EGD revealed gastric and duodenal ulcers thought to be due to long-standing NSAID use.  Anthony Sparks has been non-compliant with medications, though this is thought to be due to his dementia.   He has been hesitant to take medication for dementia. At his last appointment on 12/03/15 he was noted to have atrial flutter with variable block and a rate of 105 bpm. He was started on metoprolol succinate 25 mg daily. Given his history of GI bleeding and non-complance he was not started on anticoagulation. He was referred to cardiology for further evaluation.  Anthony Sparks reports feeling well.  He endorses mild shortness of breath and bilateral lower extremity edema that has been getting worse lately. He notes that he does not always take his Lasix as prescribed because he does not like having to get up to go to the bathroom frequently. He is unable to walk for long distances for over a year. This is due to leg pain and feeling that his legs will give out underneath him. He denies any orthopnea or PND. He notes episodes of chest pain that lasts for one to 2 seconds at a time. This typically occurs at rest and is not associated with shortness of breath, nausea, vomiting, or diaphoresis. He has not noticed any palpitations but does sometimes no lightheadedness or dizziness. He denies syncope.  Past Medical History:  Diagnosis  Date  . Blood transfusion without reported diagnosis   . Dementia    Moderate per Dr Tish Frederickson- patient and family deny  . GI bleed   . Gout   . Hypertension   . Kidney disease    born with one kidney  . Macular degeneration of both eyes   . Malignant melanoma ([name])    MOHS  . Prostate cancer ([name]) 2012    Past Surgical History:  Procedure Laterality Date  . CARDIAC CATHETERIZATION     Cataract  . cataract surgery Bilateral    6-7 yrs ago  . ESOPHAGOGASTRODUODENOSCOPY N/A 09/19/2014   Procedure: ESOPHAGOGASTRODUODENOSCOPY (EGD);  Surgeon: [name], MD;  [facility] ENDOSCOPY;  Service: Endoscopy;  Laterality: N/A;  . SKIN DEBRIDEMENT Left 11/03/2015   Procedure:  SKIN GRAFT TO LEFT CHEEK;  Surgeon: [name], DO;  Location: [place];  Service: Plastics;  Laterality: Left;     Current Outpatient Prescriptions  Medication Sig Dispense Refill  . allopurinol (ZYLOPRIM) 100 MG tablet Take 1 tablet (100 mg total) by mouth daily. 30 tablet 5  . aspirin 81 MG tablet Take 81 mg by mouth daily.    . furosemide (LASIX) 80 MG tablet Take 1 tablet (80 mg total) by mouth 2 (two) times daily. (Patient taking differently: Take 80 mg by mouth daily. ) 60 tablet 3  . metoprolol succinate (TOPROL-XL) 25 MG 24 hr tablet Take 1 tablet (25 mg total) by mouth daily.  90 tablet 3  . NIFEdipine (PROCARDIA-XL/ADALAT-CC/NIFEDICAL-XL) 30 MG 24 hr tablet Take 1 tablet (30 mg total) by mouth daily. 30 tablet 11  . NIFEdipine (PROCARDIA-XL/ADALAT-CC/NIFEDICAL-XL) 30 MG 24 hr tablet TAKE 1 TABLET BY MOUTH ONCE A DAY 30 tablet 11  . pantoprazole (PROTONIX) 40 MG tablet Take 1 tablet (40 mg total) by mouth daily. 30 tablet 11   No current facility-administered medications for this visit.     Allergies:   Sulfa antibiotics; Sulfamethoxazole; and Augmentin [amoxicillin-pot clavulanate]    Social History:  The patient  reports that he quit smoking about 37 years ago. His smoking use included  Cigarettes. He has never used smokeless tobacco. He reports that he drinks about 4.2 oz of alcohol per week . He reports that he does not use drugs.   Family History:  The patient's Family history is unknown by patient.    ROS:  Please see the history of present illness.   Otherwise, review of systems are positive for none.   All other systems are reviewed and negative.    PHYSICAL EXAM: VS:  BP 118/80   Pulse (!) 113   Ht 5\' 6"  (1.676 m)  , BMI There is no height or weight on file to calculate BMI. GENERAL:  Well appearing HEENT:  Pupils equal round and reactive, fundi not visualized, oral mucosa unremarkable NECK:  No jugular venous distention, waveform within normal limits, carotid upstroke brisk and symmetric, no bruits, no thyromegaly LYMPHATICS:  No cervical adenopathy LUNGS:  Clear to auscultation bilaterally HEART:  Irregularly irregular.  PMI not displaced or sustained,S1 and S2 within normal limits, no S3, no S4, no clicks, no rubs, no murmurs ABD:  Flat, positive bowel sounds normal in frequency in pitch, no bruits, no rebound, no guarding, no midline pulsatile mass, no hepatomegaly, no splenomegaly EXT:  2 plus pulses throughout, no edema, no cyanosis no clubbing SKIN:  No rashes no nodules NEURO:  Cranial nerves II through XII grossly intact, motor grossly intact throughout PSYCH:  Cognitively intact, oriented to person place and time    EKG:  EKG is ordered today. The ekg ordered today demonstrates atrial flutter with variable ventricular conduction. Rate 113 bpm LVH with repolarization abnormality.   Recent Labs: 09/28/2015: ALT 17 11/03/2015: BUN 35; Creatinine, Ser 2.52; Hemoglobin 14.0; Platelets 189; Potassium 3.9; Sodium 134    Lipid Panel    Component Value Date/Time   CHOL 147 09/28/2015 0931   TRIG 165 (H) 09/28/2015 0931   HDL 50 09/28/2015 0931   CHOLHDL 2.9 09/28/2015 0931   VLDL 33 (H) 09/28/2015 0931   LDLCALC 64 09/28/2015 0931      Wt Readings  from Last 3 Encounters:  12/03/15 182 lb (82.6 kg)  11/03/15 180 lb (81.6 kg)  10/05/15 188 lb (85.3 kg)      ASSESSMENT AND PLAN:  # Atrial flutter:  Mr. Berendsen has atrial flutter and the ventricular rates are not well-controlled. We will increase his metoprolol to 50 mg daily. I agree with the assessment by Dr. Dennard Schaumann that he is not a good candidate for oral anticoagulation. He has a difficulty managing his medications and is not compliant. He also has a history of GI bleeding and CKD IV. Okay to continue with aspirin for now.  We will check a TSH and basic metabolic panel. We will also check an echocardiogram.  # LE Edema: This is a long-standing issue. We discussed the importance of elevating his legs when sitting.  He will  also continue to work on reducing the salt in his diet. I recommended that he wear compression socks. We will check an echocardiogram as above. He will committed to trying to take his Lasix more regularly.  # Hypertension: blood pressure is low-controlled today. However, given that we were increasing his metoprolol we will stop nifedipine. If his blood pressure is elevated. Heart rate is not well controlled, we can continue to titrate metoprolol.  Current medicines are reviewed at length with the patient today.  The patient does not have concerns regarding medicines.  The following changes have been made:  no change  Labs/ tests ordered today include:  No orders of the defined types were placed in this encounter.    Disposition:   FU with PA in 2 weeks.  Alecxander Mainwaring C. Oval Linsey, MD, [facility] in 1 month.    This note was written with the assistance of speech recognition software.  Please excuse any transcriptional errors.  Signed, Zacharey Jensen C. Oval Linsey, MD, [facility]  01/11/2016 2:32 PM    [facility]

## 2016-01-12 LAB — TSH: TSH: 1.74 mIU/L (ref 0.40–4.50)

## 2016-01-12 LAB — T4, FREE: FREE T4: 1.2 ng/dL (ref 0.8–1.8)

## 2016-01-14 ENCOUNTER — Telehealth: Payer: Self-pay | Admitting: Cardiovascular Disease

## 2016-01-14 NOTE — Telephone Encounter (Signed)
New message ° ° ° ° ° ° °Calling to get lab results °

## 2016-01-14 NOTE — Telephone Encounter (Signed)
Returned call-made aware of lab results.  No further questions at this time.

## 2016-01-20 ENCOUNTER — Other Ambulatory Visit: Payer: Self-pay

## 2016-01-20 ENCOUNTER — Ambulatory Visit (HOSPITAL_COMMUNITY): Payer: PPO | Attending: Cardiology

## 2016-01-20 DIAGNOSIS — I4892 Unspecified atrial flutter: Secondary | ICD-10-CM

## 2016-01-20 DIAGNOSIS — I340 Nonrheumatic mitral (valve) insufficiency: Secondary | ICD-10-CM | POA: Insufficient documentation

## 2016-01-20 DIAGNOSIS — I272 Other secondary pulmonary hypertension: Secondary | ICD-10-CM | POA: Diagnosis not present

## 2016-01-20 DIAGNOSIS — I10 Essential (primary) hypertension: Secondary | ICD-10-CM | POA: Insufficient documentation

## 2016-01-31 ENCOUNTER — Encounter: Payer: Self-pay | Admitting: Physician Assistant

## 2016-01-31 ENCOUNTER — Ambulatory Visit (INDEPENDENT_AMBULATORY_CARE_PROVIDER_SITE_OTHER): Payer: PPO | Admitting: Physician Assistant

## 2016-01-31 VITALS — BP 141/86 | HR 129 | Ht 66.0 in | Wt 182.6 lb

## 2016-01-31 DIAGNOSIS — I4892 Unspecified atrial flutter: Secondary | ICD-10-CM

## 2016-01-31 MED ORDER — METOPROLOL SUCCINATE ER 50 MG PO TB24: 50.0000 mg | ORAL_TABLET | Freq: Two times a day (BID) | ORAL | 6 refills | Status: DC

## 2016-01-31 NOTE — Progress Notes (Signed)
Cardiology Office Note   Date:  01/31/2016   ID:  Astrid Divine, DOB [birth year], MRN [id number]  PCP:  Odette Fraction, MD  Cardiologist:  Dr Oliver Barre, PA-C   Chief Complaint  Patient presents with  . Follow-up    edema in feet and legs. Pt states no other Sx.    History of Present Illness: Nishil Lazo is a 82 y.o. male with a history of HTN, CKD IV, UGIB, dementia, melanoma. Dx atrial flutter 12/03/2015 and started on Toprol XL 25 mg>>cards referral to discuss anticoag>>metoprolol increased, ASA only for anticoag, echo ordered. LE edema noted, low Na diet and elevation recommended.  Roger Symes presents for Follow-up for his atrial fibrillation.  He is accompanied by his wife and his son. He has no awareness of the atrial fibrillation. His wife has increased the metoprolol as directed, currently she is giving him 2-25 milligram tablets of the Toprol-XL. They do not have a blood pressure cuff for weight recheck his blood pressure home. His wife is concerned that we will try to start him on additional blood thinners, which she does not wish him to have because of his history of GI bleeding.  He is very weak at baseline. He walks very little and generally gets around with a wheelchair. His wife states that he sits in the chair most of the day. His lower extremity edema is better. She struggles to put on the compression stockings but is continuing to try this. She has gotten some that are lighter in weight and hopes this will help her. She is making sure he goes to bed at night as he tended to sleep chair. She is also trying to get him to put his feet up when he is sitting in the chair and feels this has helped him as well.  He feels his dyspnea on exertion is at baseline and can't tell any real difference.  He is compliant with his Lasix, his nephrologist manages his volume status.   Past Medical History:  Diagnosis Date  . Atrial flutter ([address])  01/11/2016  . Blood transfusion without reported diagnosis   . Dementia    Moderate per Dr Tish Frederickson- patient and family deny  . Essential hypertension 01/11/2016  . GI bleed   . Gout   . Hypertension   . Kidney disease    born with one kidney  . Macular degeneration of both eyes   . Malignant melanoma ([place])    MOHS  . Prostate cancer ([place]) 2012    Past Surgical History:  Procedure Laterality Date  . CARDIAC CATHETERIZATION     Cataract  . cataract surgery Bilateral    6-7 yrs ago  . ESOPHAGOGASTRODUODENOSCOPY N/A 09/19/2014   Procedure: ESOPHAGOGASTRODUODENOSCOPY (EGD);  Surgeon: [name], MD;  Location: [facility] ENDOSCOPY;  Service: Endoscopy;  Laterality: N/A;  . SKIN DEBRIDEMENT Left 11/03/2015   Procedure:  SKIN GRAFT TO LEFT CHEEK;  Surgeon: [name], DO;  Location: [place];  Service: Plastics;  Laterality: Left;    Current Outpatient Prescriptions  Medication Sig Dispense Refill  . allopurinol (ZYLOPRIM) 100 MG tablet Take 1 tablet (100 mg total) by mouth daily. 30 tablet 5  . aspirin 81 MG tablet Take 81 mg by mouth daily.    . furosemide (LASIX) 80 MG tablet Take 1 tablet (80 mg total) by mouth 2 (two) times daily. (Patient taking differently: Take 80 mg by mouth daily. ) 60 tablet 3  . metoprolol succinate (TOPROL-XL)  50 MG 24 hr tablet Take 1 tablet (50 mg total) by mouth daily. 30 tablet 5  . NIFEdipine (PROCARDIA-XL/ADALAT-CC/NIFEDICAL-XL) 30 MG 24 hr tablet Take 1 tablet (30 mg total) by mouth daily. 30 tablet 11  . NIFEdipine (PROCARDIA-XL/ADALAT-CC/NIFEDICAL-XL) 30 MG 24 hr tablet TAKE 1 TABLET BY MOUTH ONCE A DAY 30 tablet 11  . pantoprazole (PROTONIX) 40 MG tablet Take 1 tablet (40 mg total) by mouth daily. 30 tablet 11   No current facility-administered medications for this visit.     Allergies:   Sulfa antibiotics; Sulfamethoxazole; and Augmentin [amoxicillin-pot clavulanate]    Social History:  The patient  reports that he quit smoking about 37  years ago. His smoking use included Cigarettes. He has never used smokeless tobacco. He reports that he drinks about 4.2 oz of alcohol per week . He reports that he does not use drugs.   Family History:  The patient's family history includes Cancer in his paternal grandfather; Heart disease in his mother.    ROS:  Please see the history of present illness. All other systems are reviewed and negative.    PHYSICAL EXAM: VS:  BP (!) 141/86   Pulse (!) 129   Ht 5\' 6"  (1.676 m)  , BMI There is no height or weight on file to calculate BMI. GEN: Well nourished, well developed, male in no acute distress  HEENT: normal for age  Neck: Minimal JVD, no carotid bruit, no masses Cardiac: Rapid and irregular; no murmur, no rubs, or gallops Respiratory: Decreased breath sounds bases with a few rales bilaterally, normal work of breathing GI: soft, nontender, nondistended, + BS MS: no deformity or atrophy; trace edema; distal pulses are 2+ in all 4 extremities   Skin: warm and dry, no rash Neuro:  Strength and sensation are intact Psych: euthymic mood, full affect   EKG:  EKG is not ordered today.  ECHO: 08/31 - Left ventricle: The cavity size was normal. Wall thickness was   normal. Indeterminant diastolic function, the patient was in   atrial flutter. Systolic function was normal. The estimated   ejection fraction was in the range of 60% to 65%. Wall motion was   normal; there were no regional wall motion abnormalities. - Aortic valve: There was no stenosis. - Mitral valve: There was trivial regurgitation. - Right ventricle: The cavity size was normal. Systolic function   was normal. - Tricuspid valve: Peak RV-RA gradient (S): 35 mm Hg. - Pulmonary arteries: PA peak pressure: 38 mm Hg (S). - Inferior vena cava: The vessel was normal in size. The   respirophasic diameter changes were in the normal range (= 50%),   consistent with normal central venous pressure. Impressions: - The patient  was in atrial flutter. Normal LV size with EF 60-65%.   Normal RV size and systolic function. Mild pulmonary   hypertension. No significant valvular abnormalities.  Recent Labs: 09/28/2015: ALT 17 11/03/2015: Hemoglobin 14.0; Platelets 189 01/11/2016: BUN 28; Creat 2.12; Potassium 3.7; Sodium 136; TSH 1.74    Lipid Panel    Component Value Date/Time   CHOL 147 09/28/2015 0931   TRIG 165 (H) 09/28/2015 0931   HDL 50 09/28/2015 0931   CHOLHDL 2.9 09/28/2015 0931   VLDL 33 (H) 09/28/2015 0931   LDLCALC 64 09/28/2015 0931     Wt Readings from Last 3 Encounters:  12/03/15 182 lb (82.6 kg)  11/03/15 180 lb (81.6 kg)  10/05/15 188 lb (85.3 kg)     Other studies Reviewed:  Additional studies/ records that were reviewed today include: Office notes and testing.   ASSESSMENT AND PLAN:  1.  Atrial fibrillation, persistent, rapid ventricular response: His rate is still elevated. He is not very symptomatic from this, but it is bad for him to go that fast all the time. We will therefore increase his metoprolol gradually. We can increase it to 1 tablet a.m. and one half tablet p.m. for 3 days, then if he tolerates that he can be increased to one tablet twice a day.  2. Anticoagulation: His CHA2DS2VASc score is 7, but with his history of GI bleeding his wife is firm that she does not wish him to be on chronic anticoagulation. He is at increased risk for problems with anticoagulation and we will not do this. Continue baby aspirin.  3. Lower extremity edema: He occasionally forgets to take his medications, but this does not happen very often and she is working to make sure it does not happen anymore. Lifestyle changes are helping. They are encouraged to continue these.  Plan: He is very weak and it is hard on him to come to the office. They do not wish to have a home health nurse. They have a granddaughter who is a therapist and can take blood pressures. They will try to get a blood pressure cuff and  she will learn to use it. If she can take his blood pressure and let us know how his blood pressure and heart rate are doing, then hopefully we can manage his medications as an outpatient.   Current medicines are reviewed at length with the patient today.  The patient does not have concerns regarding medicines.  The following changes have been made:  Increase metoprolol  Labs/ tests ordered today include:  No orders of the defined types were placed in this encounter.    Disposition:   FU with Dr Oval Linsey  Signed, Rosaria Ferries, PA-C  01/31/2016 2:26 PM    [place] Phone: [phone]; Fax: [phone]  This note was written with the assistance of speech recognition software. Please excuse any transcriptional errors.

## 2016-01-31 NOTE — Patient Instructions (Addendum)
Medications:  Take an extra 1/2 tab of Metoprolol at bedtime for 3 days. Then take 1 tab twice daily.   Other Instructions:  Get a BP cuff to check and record your blood pressures daily. Your physician has requested that you regularly monitor and record your blood pressure readings at home. Please use the same machine at the same time of day to check your readings and record them to bring to your follow-up visit. Target systolic blood pressure (top number) is no less than 100. If it falls too far below 100 and you feel lightheaded or dizzy, please call.  A target heart rate of about 100 is ok, 60-100 is the average normal heart rate. Please call if your heart rate falls below 60.   Follow-Up:  Your physician recommends that you schedule a follow-up appointment in: 1 month with Dr. Oval Linsey. If you need a refill on your cardiac medications before your next appointment, please call your pharmacy.

## 2016-02-02 ENCOUNTER — Telehealth: Payer: Self-pay | Admitting: *Deleted

## 2016-02-02 NOTE — Telephone Encounter (Signed)
AVS Reports   Date/Time Report Action User  01/31/2016 3:06 PM After Visit Summary Printed Therisa Doyne  01/31/2016 2:25 PM After Visit Summary Printed Therisa Doyne  Patient Instructions   Medications:  Take an extra 1/2 tab of Metoprolol at bedtime for 3 days. Then take 1 tab twice daily.   Other Instructions:  Get a BP cuff to check and record your blood pressures daily. Your physician has requested that you regularly monitor and record your blood pressure readings at home. Please use the same machine at the same time of day to check your readings and record them to bring to your follow-up visit. Target systolic blood pressure (top number) is no less than 100. If it falls too far below 100 and you feel lightheaded or dizzy, please call.  A target heart rate of about 100 is ok, 60-100 is the average normal heart rate. Please call if your heart rate falls below 60.    called to have OV instructions about metoprolol and BP/HR ranges explained to her, i read her the instructions and explained that a HR of 70 was within range, Wife expressed understanding

## 2016-02-07 ENCOUNTER — Telehealth: Payer: Self-pay | Admitting: Family Medicine

## 2016-02-07 NOTE — Telephone Encounter (Signed)
Received fax from [facility] back Auth# [id number] Treating Provider Claude Manges MD # of Visits 1 Start Date 01/19/2016 End Date 07/18/2015 CPT 93306 DX I48.92

## 2016-02-15 ENCOUNTER — Encounter: Payer: Self-pay | Admitting: Podiatry

## 2016-02-15 ENCOUNTER — Ambulatory Visit (INDEPENDENT_AMBULATORY_CARE_PROVIDER_SITE_OTHER): Payer: PPO | Admitting: Podiatry

## 2016-02-15 VITALS — Resp 14 | Ht 66.0 in | Wt 182.0 lb

## 2016-02-15 DIAGNOSIS — L608 Other nail disorders: Secondary | ICD-10-CM

## 2016-02-15 DIAGNOSIS — L603 Nail dystrophy: Secondary | ICD-10-CM | POA: Diagnosis not present

## 2016-02-15 DIAGNOSIS — B351 Tinea unguium: Secondary | ICD-10-CM | POA: Diagnosis not present

## 2016-02-15 DIAGNOSIS — M79676 Pain in unspecified toe(s): Secondary | ICD-10-CM | POA: Diagnosis not present

## 2016-02-15 DIAGNOSIS — M79609 Pain in unspecified limb: Principal | ICD-10-CM

## 2016-02-15 DIAGNOSIS — L609 Nail disorder, unspecified: Secondary | ICD-10-CM | POA: Diagnosis not present

## 2016-02-15 DIAGNOSIS — S90221A Contusion of right lesser toe(s) with damage to nail, initial encounter: Secondary | ICD-10-CM | POA: Diagnosis not present

## 2016-02-15 NOTE — Patient Instructions (Signed)

## 2016-02-15 NOTE — Progress Notes (Signed)
Patient ID: Anthony Sparks, male   DOB: [birth year], 82 y.o.   MRN: [id number] SUBJECTIVE Patient  presents to office today complaining of elongated, thickened nails. Pain while ambulating in shoes. Patient is unable to trim their own nails.   Patient also complains of a right great toenail which does have trauma with purulence and bleeding. Patient's concerned that possibly needs to be removed.  OBJECTIVE General Patient is awake, alert, and oriented x 3 and in no acute distress. Derm partially detached, painful toenail noted to the right great toe with underlying hematoma formation.   Skin is dry and supple bilateral. Negative open lesions or macerations. Remaining integument unremarkable. Nails are tender, long, thickened and dystrophic with subungual debris, consistent with onychomycosis, 1-5 bilateral. No signs of infection noted. Vasc  DP and PT pedal pulses palpable bilaterally. Temperature gradient within normal limits.  Neuro Epicritic and protective threshold sensation diminished bilaterally.  Musculoskeletal Exam No symptomatic pedal deformities noted bilateral. Muscular strength within normal limits.  ASSESSMENT 1. Onychodystrophic nails 1-5 bilateral with hyperkeratosis of nails.  2. Onychomycosis of nail due to dermatophyte bilateral 3. Pain in foot bilateral 4. Right great toenail trauma with underlying subungual hematoma  PLAN OF CARE 1. Patient evaluated today.  2. Instructed to maintain good pedal hygiene and foot care.  3. Mechanical debridement of nails 1-5 bilaterally performed using a nail nipper. Filed with dremel without incident.  4. Right great total nail avulsion was performed without anesthesia today. Patient does have loss of sensation. Nail was 75% detached. Nail was freed from underlying nail root. Dry sterile dressing was applied.  5. Return to clinic in 3 mos.    Edrick Kins, DPM

## 2016-02-15 NOTE — Progress Notes (Signed)
   Subjective:    Patient ID: Anthony Sparks, male    DOB: [birth year], 82 y.o.   MRN: [id number]  HPI    Review of Systems  Musculoskeletal: Positive for arthralgias.       Objective:   Physical Exam        Assessment & Plan:

## 2016-03-02 ENCOUNTER — Encounter: Payer: Self-pay | Admitting: Cardiovascular Disease

## 2016-03-02 ENCOUNTER — Ambulatory Visit (INDEPENDENT_AMBULATORY_CARE_PROVIDER_SITE_OTHER): Payer: PPO | Admitting: Cardiovascular Disease

## 2016-03-02 VITALS — BP 120/75 | HR 86 | Ht 66.0 in | Wt 180.0 lb

## 2016-03-02 DIAGNOSIS — I10 Essential (primary) hypertension: Secondary | ICD-10-CM

## 2016-03-02 DIAGNOSIS — I4892 Unspecified atrial flutter: Secondary | ICD-10-CM

## 2016-03-02 DIAGNOSIS — R600 Localized edema: Secondary | ICD-10-CM | POA: Diagnosis not present

## 2016-03-02 MED ORDER — METOPROLOL SUCCINATE ER 25 MG PO TB24: ORAL_TABLET | ORAL | 5 refills | Status: DC

## 2016-03-02 NOTE — Progress Notes (Signed)
Cardiology Office Note   Date:  03/02/2016   ID:  Astrid Divine, DOB 04-06-1928, MRN [id number]  PCP:  Odette Fraction, MD  Cardiologist:   Skeet Latch, MD   Chief Complaint  Patient presents with  . Follow-up    pt take metoprolol 50 mg daily      History of Present Illness: Anthony Sparks is a 82 y.o. male with hypertension, dementia, melanoma, CKD IV and prior GI bleed who presents for follow up on atrial flutter.  Anthony Sparks was hospitalized 4/30 through 5/6 2016 for an upper GI bleed. EGD revealed gastric and duodenal ulcers thought to be due to long-standing NSAID use.  Anthony Sparks has been non-compliant with medications, though this is thought to be due to his dementia.   He has been hesitant to take medication for dementia. At his last appointment on 12/03/15 he was noted to have atrial flutter with variable block and a rate of 105 bpm. He was started on metoprolol succinate 25 mg daily. Given his history of GI bleeding and non-complance he was not started on anticoagulation.   At his last appointment his heart rates were not well-controlled. Metoprolol was increased to 50 mg. He was also referred for an echocardiogram 01/20/16 that revealed LVEF 60-65% and was otherwise unremarkable.  He was felt not to be good candidate for oral anticoagulation due to chronic kidney disease, prior GI bleeding, and noncompliance.  At that appointment he endorsed shortness of breath and lower extremity edema and was encouraged to take his Lasix more regularly.  Since that appointment he has been doing well.  He has been taking his lasix every day and wearing his compression stockings.  The edema has improved and he denies lower extremity edema.  He has been taking metoprolol 50 mg daily and his BP has ranged from 78/52 tto 150/99.  It has been mostly in the [id number] systolic.  His heart rates have been in the 60-90s.  He has felt generally well and his only complaint is that he hates  wearing the compression stockings.  He does report one fall that this was mechanical. He denies any lightheadedness or dizziness   Past Medical History:  Diagnosis Date  . Atrial flutter ([place]) 01/11/2016  . Blood transfusion without reported diagnosis   . Dementia    Moderate per Dr Tish Frederickson- patient and family deny  . Essential hypertension 01/11/2016  . GI bleed   . Gout   . Hypertension   . Kidney disease    born with one kidney  . Macular degeneration of both eyes   . Malignant melanoma ([place])    MOHS  . Prostate cancer ([place]) 2012    Past Surgical History:  Procedure Laterality Date  . CARDIAC CATHETERIZATION     Cataract  . cataract surgery Bilateral    6-7 yrs ago  . ESOPHAGOGASTRODUODENOSCOPY N/A 09/19/2014   Procedure: ESOPHAGOGASTRODUODENOSCOPY (EGD);  Surgeon: [name], MD;  [facility] ENDOSCOPY;  Service: Endoscopy;  Laterality: N/A;  . SKIN DEBRIDEMENT Left 11/03/2015   Procedure:  SKIN GRAFT TO LEFT CHEEK;  Surgeon: [name], DO;  Location: [facility];  Service: Plastics;  Laterality: Left;     Current Outpatient Prescriptions  Medication Sig Dispense Refill  . allopurinol (ZYLOPRIM) 100 MG tablet Take 1 tablet (100 mg total) by mouth daily. 30 tablet 5  . aspirin 81 MG tablet Take 81 mg by mouth daily.    . furosemide (LASIX) 80 MG tablet Take 1  tablet (80 mg total) by mouth 2 (two) times daily. (Patient taking differently: Take 80 mg by mouth daily. ) 60 tablet 3  . metoprolol succinate (TOPROL-XL) 25 MG 24 hr tablet TAKE 1 AND 1/2 TABLET BY MOUTH DAILY 45 tablet 5  . pantoprazole (PROTONIX) 40 MG tablet Take 1 tablet (40 mg total) by mouth daily. 30 tablet 11   No current facility-administered medications for this visit.     Allergies:   Sulfa antibiotics; Sulfamethoxazole; and Augmentin [amoxicillin-pot clavulanate]    Social History:  The patient  reports that he quit smoking about 37 years ago. His smoking use included Cigarettes. He has  never used smokeless tobacco. He reports that he drinks about 4.2 oz of alcohol per week . He reports that he does not use drugs.   Family History:  The patient's family history includes Cancer in his paternal grandfather; Heart disease in his mother.    ROS:  Please see the history of present illness.   Otherwise, review of systems are positive for none.   All other systems are reviewed and negative.    PHYSICAL EXAM: VS:  BP 120/75   Pulse 86   Ht 5\' 6"  (1.676 m)   Wt 180 lb (81.6 kg)   BMI 29.05 kg/m  , BMI Body mass index is 29.05 kg/m. GENERAL:  Well appearing HEENT:  Pupils equal round and reactive, fundi not visualized, oral mucosa unremarkable NECK:  No jugular venous distention, waveform within normal limits, carotid upstroke brisk and symmetric, no bruits, no thyromegaly LYMPHATICS:  No cervical adenopathy LUNGS:  Clear to auscultation bilaterally HEART:  Irregularly irregular.  PMI not displaced or sustained,S1 and S2 within normal limits, no S3, no S4, no clicks, no rubs, no murmurs ABD:  Flat, positive bowel sounds normal in frequency in pitch, no bruits, no rebound, no guarding, no midline pulsatile mass, no hepatomegaly, no splenomegaly EXT:  2 plus pulses throughout, no edema, no cyanosis no clubbing SKIN:  No rashes no nodules NEURO:  Cranial nerves II through XII grossly intact, motor grossly intact throughout PSYCH:  Cognitively intact, oriented to person place and time   EKG:  EKG is ordered today. The ekg ordered today demonstrates atrial flutter with variable ventricular conduction. Rate 113 bpm LVH with repolarization abnormality.   Recent Labs: 09/28/2015: ALT 17 11/03/2015: Hemoglobin 14.0; Platelets 189 01/11/2016: BUN 28; Creat 2.12; Potassium 3.7; Sodium 136; TSH 1.74   Echo 01/20/16: Study Conclusions  - Left ventricle: The cavity size was normal. Wall thickness was   normal. Indeterminant diastolic function, the patient was in   atrial flutter.  Systolic function was normal. The estimated   ejection fraction was in the range of 60% to 65%. Wall motion was   normal; there were no regional wall motion abnormalities. - Aortic valve: There was no stenosis. - Mitral valve: There was trivial regurgitation. - Right ventricle: The cavity size was normal. Systolic function   was normal. - Tricuspid valve: Peak RV-RA gradient (S): 35 mm Hg. - Pulmonary arteries: PA peak pressure: 38 mm Hg (S). - Inferior vena cava: The vessel was normal in size. The   respirophasic diameter changes were in the normal range (= 50%),   consistent with normal central venous pressure.  Lipid Panel    Component Value Date/Time   CHOL 147 09/28/2015 0931   TRIG 165 (H) 09/28/2015 0931   HDL 50 09/28/2015 0931   CHOLHDL 2.9 09/28/2015 0931   VLDL 33 (H) 09/28/2015 [id number]  LDLCALC 64 09/28/2015 0931      Wt Readings from Last 3 Encounters:  03/02/16 180 lb (81.6 kg)  02/15/16 182 lb (82.6 kg)  01/31/16 182 lb 9.6 oz (82.8 kg)      ASSESSMENT AND PLAN:  # Atrial flutter:  Mr. Erhardt has atrial flutter and the ventricular rates much better controlled.  However he has low blood pressure on the higher dose of metoprolol.  We will reduce metoprolol to 37.5 mg daily.  He is not a good candidate for oral anticoagulation. He has a difficulty managing his medications and is not compliant. He also has a history of GI bleeding and CKD IV. Okay to continue with aspirin for now. Echo was unremarkable.   # LE Edema: This is a long-standing issue.  It has improved significantly with Lasix and the use of compression stockings. He continues to have some edema but it is much better.  # Hypertension: Blood pressure is low today. Reduce metoprolol to 37.5mg  daily.  Current medicines are reviewed at length with the patient today.  The patient does not have concerns regarding medicines.  The following changes have been made:  no change  Labs/ tests ordered today  include:  No orders of the defined types were placed in this encounter.    Disposition:   FU with Anthony Sparks C. Oval Linsey, MD, [facility] in 3 months.    This note was written with the assistance of speech recognition software.  Please excuse any transcriptional errors.  Signed, Shaindel Sweeten C. Oval Linsey, MD, [facility]  03/02/2016 2:14 PM    [facility]

## 2016-03-02 NOTE — Patient Instructions (Addendum)
Medication Instructions:  START TAKING METOPROLOL 25 MG 1 & 1/2 TABLET DAILY   Labwork: NONE  Testing/Procedures: NONE  Follow-Up: Your physician recommends that you schedule a follow-up appointment in: 3 MONTH OV  If you need a refill on your cardiac medications before your next appointment, please call your pharmacy.

## 2016-04-12 ENCOUNTER — Ambulatory Visit (INDEPENDENT_AMBULATORY_CARE_PROVIDER_SITE_OTHER): Payer: PPO | Admitting: Family Medicine

## 2016-04-12 VITALS — HR 78 | Temp 98.2°F | Resp 18 | Wt 176.0 lb

## 2016-04-12 DIAGNOSIS — R031 Nonspecific low blood-pressure reading: Secondary | ICD-10-CM

## 2016-04-12 DIAGNOSIS — Z23 Encounter for immunization: Secondary | ICD-10-CM | POA: Diagnosis not present

## 2016-04-12 DIAGNOSIS — I483 Typical atrial flutter: Secondary | ICD-10-CM | POA: Diagnosis not present

## 2016-04-12 LAB — COMPLETE METABOLIC PANEL WITH GFR
ALT: 7 U/L — ABNORMAL LOW (ref 9–46)
AST: 17 U/L (ref 10–35)
Albumin: 3.5 g/dL — ABNORMAL LOW (ref 3.6–5.1)
Alkaline Phosphatase: 71 U/L (ref 40–115)
BUN: 38 mg/dL — ABNORMAL HIGH (ref 7–25)
CHLORIDE: 95 mmol/L — AB (ref 98–110)
CO2: 26 mmol/L (ref 20–31)
Calcium: 8.8 mg/dL (ref 8.6–10.3)
Creat: 2.55 mg/dL — ABNORMAL HIGH (ref 0.70–1.11)
GFR, EST NON AFRICAN AMERICAN: 22 mL/min — AB (ref >=60)
GFR, Est African American: 25 mL/min — ABNORMAL LOW (ref >=60)
GLUCOSE: 83 mg/dL (ref 70–99)
POTASSIUM: 4.2 mmol/L (ref 3.5–5.3)
SODIUM: 136 mmol/L (ref 135–146)
Total Bilirubin: 1.1 mg/dL (ref 0.2–1.2)
Total Protein: 7.1 g/dL (ref 6.1–8.1)

## 2016-04-12 LAB — CBC WITH DIFFERENTIAL/PLATELET
BASOS ABS: 55 {cells}/uL (ref 0–200)
Basophils Relative: 1 %
EOS PCT: 3 %
Eosinophils Absolute: 165 cells/uL (ref 15–500)
HCT: 44.7 % (ref 38.5–50.0)
Hemoglobin: 14.5 g/dL (ref 13.0–17.0)
Lymphocytes Relative: 40 %
Lymphs Abs: 2200 cells/uL (ref 850–3900)
MCH: 32.9 pg (ref 27.0–33.0)
MCHC: 32.4 g/dL (ref 32.0–36.0)
MCV: 101.4 fL — ABNORMAL HIGH (ref 80.0–100.0)
MONOS PCT: 9 %
MPV: 9.6 fL (ref 7.5–12.5)
Monocytes Absolute: 495 cells/uL (ref 200–950)
NEUTROS ABS: 2585 {cells}/uL (ref 1500–7800)
NEUTROS PCT: 47 %
PLATELETS: 202 10*3/uL (ref 140–400)
RBC: 4.41 MIL/uL (ref 4.20–5.80)
RDW: 15.9 % — ABNORMAL HIGH (ref 11.0–15.0)
WBC: 5.5 10*3/uL (ref 3.8–10.8)

## 2016-04-12 NOTE — Progress Notes (Signed)
Subjective:    Patient ID: Anthony Sparks, male    DOB: May 07, 1928, 82 y.o.   MRN: [id number]  HPI 10/2014 Patient is here today to establish care. He was recently admitted to the hospital with an upper GI bleed. EGD revealed gastric ulcers and duodenal ulcers attributed to long-standing NSAID use. He was treated with a protonix  IV drip.   He was supposed to be transitioned to protonix  Twice a day. However at the present time he is not taking a proton pump inhibitor. He denies any melanotic or hematochezia. He is accompanied today by his wife. The patient does not speak very much at all. His wife states that he sits in the room by himself. He simply sits in a chair. He does not watch TV. He is not active. I asked the patient if he feels depressed and he says no. However I ask him what makes him happy and he is unable to give me an answer. He seems very confused and unable to answer my questions. Therefore I performed a Mini-Mental status exam. A shunt is unable to tell me the month the day of the year. He is able to tell me the location. He is unable to perform serial sevens. He is unable to spell world in reverse. He is able to remember 1 out of 3 objects on rapid recall. When asked the patient to draw the face of a clock he is unable to put the hands of the clock in the appropriate position nor is he able to show the correct time. Furthermore he is only able to write 2 numbers and they're located in the wrong area.   I am concerned that the patient has moderate to severe dementia and that he sits quietly at home and since he does not talk very much,  It has gone unnoticed. He also has a history of chronic kidney disease for which he takes Lasix 80 mg by mouth daily for swelling. On examination today his legs show chronic venous stasis changes and venous stasis dermatitis. There is no pitting edema.At that time, my plan was:   I am very concerned by the fact the patient is not on a PPI given his recent  upper GI bleed. Therefore I recommended that they start Protonix 40 mg by mouth twice a day for at least 3 months and then once daily thereafter.   Patient certainly may be battling depression but I'm concerned he has moderate to severe dementia. Therefore I will check a CBC, CMP, TSH, vitamin B12, and a methylmalonic acid level. If underlying causes her not determined , I would like to start the patient on Aricept and Namenda. However the patient's wife seems very hesitant to accept the diagnosis that the patient may have dementia and therefore I would like to schedule him to see a neurologist for a second opinion. Patient received Pneumovax 23 today in clinic. Given his age and his frailty I would not recommend a colonoscopy. He has a history of prostate cancer but he refuses any treatment and therefore I do not see the utility in checking a PSA.  09/28/15 Patient has not been seen since. He did see neurology who also felt that he has dementia however the patient will not take any medication for memory loss. His blood pressure is extremely elevated today. He has been off nifedipine for 3 days. However his blood pressure seems drastically elevated even have been off nifedipine. He is also had a 3 day  history of productive cough. On examination today, he has left basilar Rales with faint expiratory wheezing consistent with bronchitis or walking pneumonia. He denies any fevers or chills or hemoptysis. He has independently stopped taking his allopurinol and his pantoprazole at home because he does not like to take pills and I believe in part due to confusion with dementia. However, he states he is compliant with his blood pressure medication. He has a large nevus on his left cheek. At the superior left portion/11:00, there is a very irregular atypical dark patch within the nevus concerning for atypia/possible melanoma. I recommended a dermatologist consultation but he declined.  At that time, my plan was: Begin a  Z-Pak. Recheck his lungs in one week. Should his breathing worsen I want him to return immediately. Resume nifedipine immediately and recheck blood pressure in one week. I believe his blood pressure is drastically elevated even to have not been on nifedipine. He may require more medicine for his blood pressure. I will check fasting lab work today. Patient does not want to see a dermatologist and therefore I suggested that we perform a biopsy of the atypical region in the large nevus on his left cheek.  He consents to this and will allow me to perform this next week when he is feeling better. I strongly recommended that he resume pantoprazole 40 mg once a day.  10/05/15 Fortunately, the patient's blood pressure is much better today at 132/86. He has been compliant taking his medication. He is not taking his Lasix however and he has significant swelling in both legs. Unfortunately the patient is noncompliant and I believe some of that can be due to his dementia. His cough is much better after having completed the antibiotic. He has a slight residual cough but it is markedly and better. He is here today for the biopsy of the lesion on his left temple/left cheek just anterior to his ear. There is a large brown nevus approximately the size of a golf ball in that location. The left superior quadrant however has dark black discoloration in that area with irregular borders concerning for possible melanoma.  At that time, my plan was: Blood pressures acceptable. I'll make no changes today and his blood pressure medication. I urged compliance with his Lasix. His cough is much better and his bronchitis seems to resolve. I am very concerned by the nevus on the left side of his face. I anesthetized that area was 0.1% lidocaine and performed a shave biopsy and Compazine approximately 7-8 mm of the atypical area described in the history of present illness. Hemostasis was achieved with Drysol. The lesion was sent to pathology  and labeled container. Of note, the patient has dementia which he declines treatment for. He has a very flat affect. He also demonstrates bradykinesia. Today on his exam for the first time I noticed a pill-rolling tremor in his right hand all which is concerning for Parkinson-like features. I discussed this with the patient and his wife that they declined referral to a neurologist at the present time  12/03/15 Unfortunately, the patient's biopsy was positive for melanoma. He has subsequently underwent excision of the melanoma under the care of a [facility] surgeon.  However I received a call earlier this week stating that he is having a rapid heartbeat. Given his history of GI bleed and his noncompliance with medication, I wanted see the patient back immediately to determine what and why he is having such a fast heart rate.  On exam  the patient today, the patient has an irregularly irregular heart rate with an elevated pulse. I reviewed his preoperative EKG obtained in June and at that time he was found to be in atrial flutter. I do not have previous EKGs to compare at this time. This is a new diagnosis for me. However the patient has a history of a GI bleed making anticoagulation risky. Furthermore he has dementia and a history of medication noncompliance. He does report some shortness of breath. He denies any chest pain. Today at the dermatologist, his heart rate was 136.  At that time, my plan was: EKG reveals atrial flutter with nonspecific ST changes in the lateral leads. Begin Toprol-XL 25 mg by mouth daily. Given his history of GI bleed and noncompliance I hesitate to put the patient on any anticoagulation. I will compromise of aspirin 81 mg by mouth daily. Consult cards.  04/12/16 Patient saw his cardiologist, Dr. Oval Linsey, October 12 and she reduced his metoprolol to 37.5 mg a day due to low blood pressure. His echocardiogram revealed no significant abnormalities.  Here today, the patient still has  extremely low blood pressures. I have a very difficult time even auscultating his blood pressure in his left arm. In his right arm I recorded 90/68. Heart rate ranges between 70 and 80 bpm. He denies any dizziness or syncope or shortness of breath or chest pain. Past Medical History:  Diagnosis Date  . Atrial flutter ([place]) 01/11/2016  . Blood transfusion without reported diagnosis   . Dementia    Moderate per Dr Tish Frederickson- patient and family deny  . Essential hypertension 01/11/2016  . GI bleed   . Gout   . Hypertension   . Kidney disease    born with one kidney  . Macular degeneration of both eyes   . Malignant melanoma ([place])    MOHS  . Prostate cancer ([place]) 2012   Past Surgical History:  Procedure Laterality Date  . CARDIAC CATHETERIZATION     Cataract  . cataract surgery Bilateral    6-7 yrs ago  . ESOPHAGOGASTRODUODENOSCOPY N/A 09/19/2014   Procedure: ESOPHAGOGASTRODUODENOSCOPY (EGD);  Surgeon: [name], MD;  Location[facility] ENDOSCOPY;  Service: Endoscopy;  Laterality: N/A;  . SKIN DEBRIDEMENT Left 11/03/2015   Procedure:  SKIN GRAFT TO LEFT CHEEK;  Surgeon: [name], DO;  Location: [place];  Service: Plastics;  Laterality: Left;   Current Outpatient Prescriptions on File Prior to Visit  Medication Sig Dispense Refill  . allopurinol (ZYLOPRIM) 100 MG tablet Take 1 tablet (100 mg total) by mouth daily. 30 tablet 5  . aspirin 81 MG tablet Take 81 mg by mouth daily.    . furosemide (LASIX) 80 MG tablet Take 1 tablet (80 mg total) by mouth 2 (two) times daily. (Patient taking differently: Take 80 mg by mouth daily. ) 60 tablet 3  . metoprolol succinate (TOPROL-XL) 25 MG 24 hr tablet TAKE 1 AND 1/2 TABLET BY MOUTH DAILY 45 tablet 5  . pantoprazole (PROTONIX) 40 MG tablet Take 1 tablet (40 mg total) by mouth daily. 30 tablet 11   No current facility-administered medications on file prior to visit.    Allergies  Allergen Reactions  . Sulfa Antibiotics Hives, Itching and  Rash  . Sulfamethoxazole Hives, Itching and Rash  . Augmentin [Amoxicillin-Pot Clavulanate] Nausea Only and Other (See Comments)    Stomach pain Has patient had a PCN reaction causing immediate rash, facial/tongue/throat swelling, SOB or lightheadedness with hypotension: no Has patient had a  PCN reaction causing severe rash involving mucus membranes or skin necrosis: no Has patient had a PCN reaction that required hospitalization no Has patient had a PCN reaction occurring within the last 10 years: no If all of the above answers are "NO", then may proceed with Cephalos   Social History   Social History  . Marital status: Married    Spouse name: betty  . Number of children: 1  . Years of education: 8   Occupational History  . retired     Marine scientist   Social History Main Topics  . Smoking status: Former Smoker    Types: Cigarettes    Quit date: 11/20/1978  . Smokeless tobacco: Never Used  . Alcohol use 4.2 oz/week    7 Shots of liquor per week     Comment: make be a drink before bed  . Drug use: No  . Sexual activity: Not Currently   Other Topics Concern  . Not on file   Social History Narrative   Lives at home with wife    caffeine use - decaff tea   Family History  Problem Relation Age of Onset  . Heart disease Mother   . Cancer Paternal Grandfather      Review of Systems  All other systems reviewed and are negative.      Objective:   Physical Exam  Constitutional: He appears well-developed and well-nourished.  Neck: Neck supple. No JVD present. No thyromegaly present.  Cardiovascular: Normal rate and normal heart sounds.  An irregular rhythm present.  No murmur heard. Pulmonary/Chest: Effort normal. No respiratory distress. He has wheezes. He has rales. He exhibits no tenderness.  Abdominal: Soft. Bowel sounds are normal. He exhibits no distension and no mass. There is no tenderness. There is no rebound and no guarding.  Musculoskeletal: Normal range of motion.  He exhibits no edema.  Lymphadenopathy:    He has no cervical adenopathy.  Neurological: He is alert. He exhibits normal muscle tone. Coordination normal.  Skin: Rash noted.  Psychiatric: Cognition and memory are impaired.  Vitals reviewed.         Assessment & Plan:  Typical atrial flutter (HCC)  Low blood pressure reading - Plan: CBC with Differential/Platelet, COMPLETE METABOLIC PANEL WITH GFR  Decrease Toprol-XL to 25 mg daily and recheck heart rate and blood pressure in one week. Check CBC to ensure that the patient is not developing anemia and low blood volume as a cause of his hypotension given his history of a GI bleed and the fact he is on aspirin. Also monitor renal function

## 2016-04-12 NOTE — Addendum Note (Signed)
Addended by: Shary Decamp B on: 04/12/2016 02:36 PM   Modules accepted: Orders

## 2016-04-18 ENCOUNTER — Encounter: Payer: Self-pay | Admitting: Family Medicine

## 2016-04-18 ENCOUNTER — Ambulatory Visit (INDEPENDENT_AMBULATORY_CARE_PROVIDER_SITE_OTHER): Payer: PPO | Admitting: Family Medicine

## 2016-04-18 VITALS — BP 110/74 | HR 76 | Temp 98.8°F | Resp 18 | Wt 172.0 lb

## 2016-04-18 DIAGNOSIS — R031 Nonspecific low blood-pressure reading: Secondary | ICD-10-CM

## 2016-04-18 DIAGNOSIS — I483 Typical atrial flutter: Secondary | ICD-10-CM

## 2016-04-18 DIAGNOSIS — N184 Chronic kidney disease, stage 4 (severe): Secondary | ICD-10-CM | POA: Insufficient documentation

## 2016-04-18 NOTE — Progress Notes (Signed)
Subjective:    Patient ID: Anthony Sparks, male    DOB: May 07, 1928, 82 y.o.   MRN: [id number]  HPI 10/2014 Patient is here today to establish care. He was recently admitted to the hospital with an upper GI bleed. EGD revealed gastric ulcers and duodenal ulcers attributed to long-standing NSAID use. He was treated with a protonix  IV drip.   He was supposed to be transitioned to protonix  Twice a day. However at the present time he is not taking a proton pump inhibitor. He denies any melanotic or hematochezia. He is accompanied today by his wife. The patient does not speak very much at all. His wife states that he sits in the room by himself. He simply sits in a chair. He does not watch TV. He is not active. I asked the patient if he feels depressed and he says no. However I ask him what makes him happy and he is unable to give me an answer. He seems very confused and unable to answer my questions. Therefore I performed a Mini-Mental status exam. A shunt is unable to tell me the month the day of the year. He is able to tell me the location. He is unable to perform serial sevens. He is unable to spell world in reverse. He is able to remember 1 out of 3 objects on rapid recall. When asked the patient to draw the face of a clock he is unable to put the hands of the clock in the appropriate position nor is he able to show the correct time. Furthermore he is only able to write 2 numbers and they're located in the wrong area.   I am concerned that the patient has moderate to severe dementia and that he sits quietly at home and since he does not talk very much,  It has gone unnoticed. He also has a history of chronic kidney disease for which he takes Lasix 80 mg by mouth daily for swelling. On examination today his legs show chronic venous stasis changes and venous stasis dermatitis. There is no pitting edema.At that time, my plan was:   I am very concerned by the fact the patient is not on a PPI given his recent  upper GI bleed. Therefore I recommended that they start Protonix 40 mg by mouth twice a day for at least 3 months and then once daily thereafter.   Patient certainly may be battling depression but I'm concerned he has moderate to severe dementia. Therefore I will check a CBC, CMP, TSH, vitamin B12, and a methylmalonic acid level. If underlying causes her not determined , I would like to start the patient on Aricept and Namenda. However the patient's wife seems very hesitant to accept the diagnosis that the patient may have dementia and therefore I would like to schedule him to see a neurologist for a second opinion. Patient received Pneumovax 23 today in clinic. Given his age and his frailty I would not recommend a colonoscopy. He has a history of prostate cancer but he refuses any treatment and therefore I do not see the utility in checking a PSA.  09/28/15 Patient has not been seen since. He did see neurology who also felt that he has dementia however the patient will not take any medication for memory loss. His blood pressure is extremely elevated today. He has been off nifedipine for 3 days. However his blood pressure seems drastically elevated even have been off nifedipine. He is also had a 3 day  history of productive cough. On examination today, he has left basilar Rales with faint expiratory wheezing consistent with bronchitis or walking pneumonia. He denies any fevers or chills or hemoptysis. He has independently stopped taking his allopurinol and his pantoprazole at home because he does not like to take pills and I believe in part due to confusion with dementia. However, he states he is compliant with his blood pressure medication. He has a large nevus on his left cheek. At the superior left portion/11:00, there is a very irregular atypical dark patch within the nevus concerning for atypia/possible melanoma. I recommended a dermatologist consultation but he declined.  At that time, my plan was: Begin a  Z-Pak. Recheck his lungs in one week. Should his breathing worsen I want him to return immediately. Resume nifedipine immediately and recheck blood pressure in one week. I believe his blood pressure is drastically elevated even to have not been on nifedipine. He may require more medicine for his blood pressure. I will check fasting lab work today. Patient does not want to see a dermatologist and therefore I suggested that we perform a biopsy of the atypical region in the large nevus on his left cheek.  He consents to this and will allow me to perform this next week when he is feeling better. I strongly recommended that he resume pantoprazole 40 mg once a day.  10/05/15 Fortunately, the patient's blood pressure is much better today at 132/86. He has been compliant taking his medication. He is not taking his Lasix however and he has significant swelling in both legs. Unfortunately the patient is noncompliant and I believe some of that can be due to his dementia. His cough is much better after having completed the antibiotic. He has a slight residual cough but it is markedly and better. He is here today for the biopsy of the lesion on his left temple/left cheek just anterior to his ear. There is a large brown nevus approximately the size of a golf ball in that location. The left superior quadrant however has dark black discoloration in that area with irregular borders concerning for possible melanoma.  At that time, my plan was: Blood pressures acceptable. I'll make no changes today and his blood pressure medication. I urged compliance with his Lasix. His cough is much better and his bronchitis seems to resolve. I am very concerned by the nevus on the left side of his face. I anesthetized that area was 0.1% lidocaine and performed a shave biopsy and Compazine approximately 7-8 mm of the atypical area described in the history of present illness. Hemostasis was achieved with Drysol. The lesion was sent to pathology  and labeled container. Of note, the patient has dementia which he declines treatment for. He has a very flat affect. He also demonstrates bradykinesia. Today on his exam for the first time I noticed a pill-rolling tremor in his right hand all which is concerning for Parkinson-like features. I discussed this with the patient and his wife that they declined referral to a neurologist at the present time  12/03/15 Unfortunately, the patient's biopsy was positive for melanoma. He has subsequently underwent excision of the melanoma under the care of a [facility] surgeon.  However I received a call earlier this week stating that he is having a rapid heartbeat. Given his history of GI bleed and his noncompliance with medication, I wanted see the patient back immediately to determine what and why he is having such a fast heart rate.  On exam  the patient today, the patient has an irregularly irregular heart rate with an elevated pulse. I reviewed his preoperative EKG obtained in June and at that time he was found to be in atrial flutter. I do not have previous EKGs to compare at this time. This is a new diagnosis for me. However the patient has a history of a GI bleed making anticoagulation risky. Furthermore he has dementia and a history of medication noncompliance. He does report some shortness of breath. He denies any chest pain. Today at the dermatologist, his heart rate was 136.  At that time, my plan was: EKG reveals atrial flutter with nonspecific ST changes in the lateral leads. Begin Toprol-XL 25 mg by mouth daily. Given his history of GI bleed and noncompliance I hesitate to put the patient on any anticoagulation. I will compromise of aspirin 81 mg by mouth daily. Consult cards.  04/12/16 Patient saw his cardiologist, Dr. Oval Linsey, October 12 and she reduced his metoprolol to 37.5 mg a day due to low blood pressure. His echocardiogram revealed no significant abnormalities.  Here today, the patient still has  extremely low blood pressures. I have a very difficult time even auscultating his blood pressure in his left arm. In his right arm I recorded 90/68. Heart rate ranges between 70 and 80 bpm. He denies any dizziness or syncope or shortness of breath or chest pain.  At that time, my plan was: Decrease Toprol-XL to 25 mg daily and recheck heart rate and blood pressure in one week. Check CBC to ensure that the patient is not developing anemia and low blood volume as a cause of his hypotension given his history of a GI bleed and the fact he is on aspirin. Also monitor renal function  04/18/16 Labs were unremarkable aside from his stable CKD.   Office Visit on 04/12/2016  Component Date Value Ref Range Status  . WBC 04/12/2016 5.5  3.8 - 10.8 K/uL Final  . RBC 04/12/2016 4.41  4.20 - 5.80 MIL/uL Final  . Hemoglobin 04/12/2016 14.5  13.0 - 17.0 g/dL Final  . HCT 04/12/2016 44.7  38.5 - 50.0 % Final  . MCV 04/12/2016 101.4* 80.0 - 100.0 fL Final  . MCH 04/12/2016 32.9  27.0 - 33.0 pg Final  . MCHC 04/12/2016 32.4  32.0 - 36.0 g/dL Final  . RDW 04/12/2016 15.9* 11.0 - 15.0 % Final  . Platelets 04/12/2016 202  140 - 400 K/uL Final  . MPV 04/12/2016 9.6  7.5 - 12.5 fL Final  . Neutro Abs 04/12/2016 2585  1,500 - 7,800 cells/uL Final  . Lymphs Abs 04/12/2016 2200  850 - 3,900 cells/uL Final  . Monocytes Absolute 04/12/2016 495  200 - 950 cells/uL Final  . Eosinophils Absolute 04/12/2016 165  15 - 500 cells/uL Final  . Basophils Absolute 04/12/2016 55  0 - 200 cells/uL Final  . Neutrophils Relative % 04/12/2016 47  % Final  . Lymphocytes Relative 04/12/2016 40  % Final  . Monocytes Relative 04/12/2016 9  % Final  . Eosinophils Relative 04/12/2016 3  % Final  . Basophils Relative 04/12/2016 1  % Final  . Smear Review 04/12/2016 Criteria for review not met   Final  . Sodium 04/12/2016 136  135 - 146 mmol/L Final  . Potassium 04/12/2016 4.2  3.5 - 5.3 mmol/L Final  . Chloride 04/12/2016 95* 98 - 110  mmol/L Final  . CO2 04/12/2016 26  20 - 31 mmol/L Final  . Glucose, Bld 04/12/2016 83  70 - 99 mg/dL Final  . BUN 04/12/2016 38* 7 - 25 mg/dL Final  . Creat 04/12/2016 2.55* 0.70 - 1.11 mg/dL Final   Comment:   For patients > or = 80 years of age: The upper reference limit for Creatinine is approximately 13% higher for people identified as African-American.     . Total Bilirubin 04/12/2016 1.1  0.2 - 1.2 mg/dL Final  . Alkaline Phosphatase 04/12/2016 71  40 - 115 U/L Final  . AST 04/12/2016 17  10 - 35 U/L Final  . ALT 04/12/2016 7* 9 - 46 U/L Final  . Total Protein 04/12/2016 7.1  6.1 - 8.1 g/dL Final  . Albumin 04/12/2016 3.5* 3.6 - 5.1 g/dL Final  . Calcium 04/12/2016 8.8  8.6 - 10.3 mg/dL Final  . GFR, Est African American 04/12/2016 25* >=60 mL/min Final  . GFR, Est Non African American 04/12/2016 22* >=60 mL/min Final  Patient's heart rate is still well controlled between 70 and 80 bpm. However his blood pressure is much better at 110/74 since reducing his Toprol-XL dose to 25 mg a day. He denies any palpitations or syncope or shortness of breath. He has lost 4 pounds since his last office visit. His albumin is low. His creatinine is also up slightly from 2.1 suggesting dehydration and poor by mouth intake.  Past Medical History:  Diagnosis Date  . Atrial flutter ([place]) 01/11/2016  . Blood transfusion without reported diagnosis   . Dementia    Moderate per Dr Tish Frederickson- patient and family deny  . Essential hypertension 01/11/2016  . GI bleed   . Gout   . Hypertension   . Kidney disease    born with one kidney  . Macular degeneration of both eyes   . Malignant melanoma ([place])    [facility]  . Prostate cancer ([place]) 2012   Past Surgical History:  Procedure Laterality Date  . CARDIAC CATHETERIZATION     Cataract  . cataract surgery Bilateral    6-7 yrs ago  . ESOPHAGOGASTRODUODENOSCOPY N/A 09/19/2014   Procedure: ESOPHAGOGASTRODUODENOSCOPY (EGD);  Surgeon: [name], MD;   Location: [facility] ENDOSCOPY;  Service: Endoscopy;  Laterality: N/A;  . SKIN DEBRIDEMENT Left 11/03/2015   Procedure:  SKIN GRAFT TO LEFT CHEEK;  Surgeon: [name], DO;  Location: [facility];  Service: Plastics;  Laterality: Left;   Current Outpatient Prescriptions on File Prior to Visit  Medication Sig Dispense Refill  . allopurinol (ZYLOPRIM) 100 MG tablet Take 1 tablet (100 mg total) by mouth daily. 30 tablet 5  . aspirin 81 MG tablet Take 81 mg by mouth daily.    . furosemide (LASIX) 80 MG tablet Take 1 tablet (80 mg total) by mouth 2 (two) times daily. (Patient taking differently: Take 80 mg by mouth daily. ) 60 tablet 3  . metoprolol succinate (TOPROL-XL) 25 MG 24 hr tablet TAKE 1 AND 1/2 TABLET BY MOUTH DAILY 45 tablet 5  . pantoprazole (PROTONIX) 40 MG tablet Take 1 tablet (40 mg total) by mouth daily. 30 tablet 11   No current facility-administered medications on file prior to visit.    Allergies  Allergen Reactions  . Sulfa Antibiotics Hives, Itching and Rash  . Sulfamethoxazole Hives, Itching and Rash  . Augmentin [Amoxicillin-Pot Clavulanate] Nausea Only and Other (See Comments)    Stomach pain Has patient had a PCN reaction causing immediate rash, facial/tongue/throat swelling, SOB or lightheadedness with hypotension: no Has patient had a PCN reaction causing severe rash involving mucus membranes  or skin necrosis: no Has patient had a PCN reaction that required hospitalization no Has patient had a PCN reaction occurring within the last 10 years: no If all of the above answers are "NO", then may proceed with Cephalos   Social History   Social History  . Marital status: Married    Spouse name: betty  . Number of children: 1  . Years of education: 8   Occupational History  . retired     Marine scientist   Social History Main Topics  . Smoking status: Former Smoker    Types: Cigarettes    Quit date: 11/20/1978  . Smokeless tobacco: Never Used  . Alcohol use 4.2 oz/week    7  Shots of liquor per week     Comment: make be a drink before bed  . Drug use: No  . Sexual activity: Not Currently   Other Topics Concern  . Not on file   Social History Narrative   Lives at home with wife    caffeine use - decaff tea   Family History  Problem Relation Age of Onset  . Heart disease Mother   . Cancer Paternal Grandfather      Review of Systems  All other systems reviewed and are negative.      Objective:   Physical Exam  Constitutional: He appears well-developed and well-nourished.  Neck: Neck supple. No JVD present. No thyromegaly present.  Cardiovascular: Normal rate and normal heart sounds.  An irregular rhythm present.  No murmur heard. Pulmonary/Chest: Effort normal. No respiratory distress. He has wheezes. He has rales. He exhibits no tenderness.  Abdominal: Soft. Bowel sounds are normal. He exhibits no distension and no mass. There is no tenderness. There is no rebound and no guarding.  Musculoskeletal: Normal range of motion. He exhibits no edema.  Lymphadenopathy:    He has no cervical adenopathy.  Neurological: He is alert. He exhibits normal muscle tone. Coordination normal.  Skin: Rash noted.  Psychiatric: Cognition and memory are impaired.  Vitals reviewed.         Assessment & Plan:  Typical atrial flutter (HCC)  Low blood pressure reading Continue Toprol-XL 25 mg a day and recheck in 3 months. I encouraged his wife to give the patient ensure 1 can with breakfast and one can with supper to address his protein calorie malnutrition. I recommended reducing his dose of Lasix to 40 mg a day for 3 days and then resuming his previous dose as he does appear slightly dehydrated

## 2016-05-19 ENCOUNTER — Telehealth: Payer: Self-pay | Admitting: *Deleted

## 2016-05-19 ENCOUNTER — Ambulatory Visit (INDEPENDENT_AMBULATORY_CARE_PROVIDER_SITE_OTHER): Payer: PPO | Admitting: Podiatry

## 2016-05-19 ENCOUNTER — Encounter: Payer: Self-pay | Admitting: Podiatry

## 2016-05-19 DIAGNOSIS — L819 Disorder of pigmentation, unspecified: Secondary | ICD-10-CM

## 2016-05-19 DIAGNOSIS — B351 Tinea unguium: Secondary | ICD-10-CM

## 2016-05-19 DIAGNOSIS — M79609 Pain in unspecified limb: Secondary | ICD-10-CM

## 2016-05-19 DIAGNOSIS — L603 Nail dystrophy: Secondary | ICD-10-CM

## 2016-05-19 DIAGNOSIS — L608 Other nail disorders: Secondary | ICD-10-CM

## 2016-05-19 DIAGNOSIS — I739 Peripheral vascular disease, unspecified: Secondary | ICD-10-CM

## 2016-05-19 NOTE — Progress Notes (Signed)
SUBJECTIVE Patient  presents to office today complaining of elongated, thickened nails. Pain while ambulating in shoes. Patient is unable to trim their own nails.   OBJECTIVE General Patient is awake, alert, and oriented x 3 and in no acute distress. Derm Skin is dry and supple bilateral. Negative open lesions or macerations. Remaining integument unremarkable. Nails are tender, long, thickened and dystrophic with subungual debris, consistent with onychomycosis, 1-5 bilateral. No signs of infection noted. Vasc  DP and PT pedal pulses palpable bilaterally. Temperature gradient within normal limits.  Neuro Epicritic and protective threshold sensation diminished bilaterally.  Musculoskeletal Exam No symptomatic pedal deformities noted bilateral. Muscular strength within normal limits.  ASSESSMENT 1. Onychodystrophic nails 1-5 bilateral with hyperkeratosis of nails.  2. Onychomycosis of nail due to dermatophyte bilateral 3. Pain in foot bilateral 4. Bilateral lower extremity discoloration secondary to vascular and venous insufficiency  PLAN OF CARE 1. Patient evaluated today.  2. Instructed to maintain good pedal hygiene and foot care.  3. Mechanical debridement of nails 1-5 bilaterally performed using a nail nipper. Filed with dremel without incident.  4. Vascular consult placed for bilateral lower extremity hemosiderin deposits and circulatory pathology  5. Return to clinic in 3 mos.    Edrick Kins, DPM [facility]  Dr. Edrick Kins, [address]                                        [place],  [address]                Office [phone]  Fax [phone]

## 2016-05-19 NOTE — Telephone Encounter (Addendum)
-----   Message from Edrick Kins, DPM sent at 05/19/2016  1:23 PM EST ----- Regarding: Vascular consult Please consult vascular here in [place]. Dx : venous insufficiency bilateral lower extremities. PVD.   Dr. Amalia Hailey. Referral, clinicals and demographics faxed to [facility].

## 2016-05-25 ENCOUNTER — Telehealth: Payer: Self-pay | Admitting: Cardiovascular Disease

## 2016-05-25 NOTE — Telephone Encounter (Signed)
Received records from [facility] for apt with Dr. Gwenlyn Found 06/14/2016. Put records with schedule paper work. mwc

## 2016-06-13 ENCOUNTER — Encounter: Payer: Self-pay | Admitting: Family Medicine

## 2016-06-13 ENCOUNTER — Ambulatory Visit (INDEPENDENT_AMBULATORY_CARE_PROVIDER_SITE_OTHER): Payer: PPO | Admitting: Family Medicine

## 2016-06-13 VITALS — Temp 97.6°F | Resp 16 | Wt 177.0 lb

## 2016-06-13 DIAGNOSIS — R0989 Other specified symptoms and signs involving the circulatory and respiratory systems: Secondary | ICD-10-CM

## 2016-06-13 NOTE — Progress Notes (Signed)
Subjective:    Patient ID: Anthony Sparks, male    DOB: [birth year], 83 y.o.   MRN: [id number]  HPI 10/2014 Patient is here today to establish care. He was recently admitted to the hospital with an upper GI bleed. EGD revealed gastric ulcers and duodenal ulcers attributed to long-standing NSAID use. He was treated with a protonix  IV drip.   He was supposed to be transitioned to protonix  Twice a day. However at the present time he is not taking a proton pump inhibitor. He denies any melanotic or hematochezia. He is accompanied today by his wife. The patient does not speak very much at all. His wife states that he sits in the room by himself. He simply sits in a chair. He does not watch TV. He is not active. I asked the patient if he feels depressed and he says no. However I ask him what makes him happy and he is unable to give me an answer. He seems very confused and unable to answer my questions. Therefore I performed a Mini-Mental status exam. A shunt is unable to tell me the month the day of the year. He is able to tell me the location. He is unable to perform serial sevens. He is unable to spell world in reverse. He is able to remember 1 out of 3 objects on rapid recall. When asked the patient to draw the face of a clock he is unable to put the hands of the clock in the appropriate position nor is he able to show the correct time. Furthermore he is only able to write 2 numbers and they're located in the wrong area.   I am concerned that the patient has moderate to severe dementia and that he sits quietly at home and since he does not talk very much,  It has gone unnoticed. He also has a history of chronic kidney disease for which he takes Lasix 80 mg by mouth daily for swelling. On examination today his legs show chronic venous stasis changes and venous stasis dermatitis. There is no pitting edema.At that time, my plan was:   I am very concerned by the fact the patient is not on a PPI given his recent  upper GI bleed. Therefore I recommended that they start Protonix 40 mg by mouth twice a day for at least 3 months and then once daily thereafter.   Patient certainly may be battling depression but I'm concerned he has moderate to severe dementia. Therefore I will check a CBC, CMP, TSH, vitamin B12, and a methylmalonic acid level. If underlying causes her not determined , I would like to start the patient on Aricept and Namenda. However the patient's wife seems very hesitant to accept the diagnosis that the patient may have dementia and therefore I would like to schedule him to see a neurologist for a second opinion. Patient received Pneumovax 23 today in clinic. Given his age and his frailty I would not recommend a colonoscopy. He has a history of prostate cancer but he refuses any treatment and therefore I do not see the utility in checking a PSA.  09/28/15 Patient has not been seen since. He did see neurology who also felt that he has dementia however the patient will not take any medication for memory loss. His blood pressure is extremely elevated today. He has been off nifedipine for 3 days. However his blood pressure seems drastically elevated even have been off nifedipine. He is also had a 3 day  history of productive cough. On examination today, he has left basilar Rales with faint expiratory wheezing consistent with bronchitis or walking pneumonia. He denies any fevers or chills or hemoptysis. He has independently stopped taking his allopurinol and his pantoprazole at home because he does not like to take pills and I believe in part due to confusion with dementia. However, he states he is compliant with his blood pressure medication. He has a large nevus on his left cheek. At the superior left portion/11:00, there is a very irregular atypical dark patch within the nevus concerning for atypia/possible melanoma. I recommended a dermatologist consultation but he declined.  At that time, my plan was: Begin a  Z-Pak. Recheck his lungs in one week. Should his breathing worsen I want him to return immediately. Resume nifedipine immediately and recheck blood pressure in one week. I believe his blood pressure is drastically elevated even to have not been on nifedipine. He may require more medicine for his blood pressure. I will check fasting lab work today. Patient does not want to see a dermatologist and therefore I suggested that we perform a biopsy of the atypical region in the large nevus on his left cheek.  He consents to this and will allow me to perform this next week when he is feeling better. I strongly recommended that he resume pantoprazole 40 mg once a day.  10/05/15 Fortunately, the patient's blood pressure is much better today at 132/86. He has been compliant taking his medication. He is not taking his Lasix however and he has significant swelling in both legs. Unfortunately the patient is noncompliant and I believe some of that can be due to his dementia. His cough is much better after having completed the antibiotic. He has a slight residual cough but it is markedly and better. He is here today for the biopsy of the lesion on his left temple/left cheek just anterior to his ear. There is a large brown nevus approximately the size of a golf ball in that location. The left superior quadrant however has dark black discoloration in that area with irregular borders concerning for possible melanoma.  At that time, my plan was: Blood pressures acceptable. I'll make no changes today and his blood pressure medication. I urged compliance with his Lasix. His cough is much better and his bronchitis seems to resolve. I am very concerned by the nevus on the left side of his face. I anesthetized that area was 0.1% lidocaine and performed a shave biopsy and Compazine approximately 7-8 mm of the atypical area described in the history of present illness. Hemostasis was achieved with Drysol. The lesion was sent to pathology  and labeled container. Of note, the patient has dementia which he declines treatment for. He has a very flat affect. He also demonstrates bradykinesia. Today on his exam for the first time I noticed a pill-rolling tremor in his right hand all which is concerning for Parkinson-like features. I discussed this with the patient and his wife that they declined referral to a neurologist at the present time  12/03/15 Unfortunately, the patient's biopsy was positive for melanoma. He has subsequently underwent excision of the melanoma under the care of a [facility] surgeon.  However I received a call earlier this week stating that he is having a rapid heartbeat. Given his history of GI bleed and his noncompliance with medication, I wanted see the patient back immediately to determine what and why he is having such a fast heart rate.  On exam  the patient today, the patient has an irregularly irregular heart rate with an elevated pulse. I reviewed his preoperative EKG obtained in June and at that time he was found to be in atrial flutter. I do not have previous EKGs to compare at this time. This is a new diagnosis for me. However the patient has a history of a GI bleed making anticoagulation risky. Furthermore he has dementia and a history of medication noncompliance. He does report some shortness of breath. He denies any chest pain. Today at the dermatologist, his heart rate was 136.  At that time, my plan was: EKG reveals atrial flutter with nonspecific ST changes in the lateral leads. Begin Toprol-XL 25 mg by mouth daily. Given his history of GI bleed and noncompliance I hesitate to put the patient on any anticoagulation. I will compromise of aspirin 81 mg by mouth daily. Consult cards.  04/12/16 Patient saw his cardiologist, Dr. Oval Linsey, October 12 and she reduced his metoprolol to 37.5 mg a day due to low blood pressure. His echocardiogram revealed no significant abnormalities.  Here today, the patient still has  extremely low blood pressures. I have a very difficult time even auscultating his blood pressure in his left arm. In his right arm I recorded 90/68. Heart rate ranges between 70 and 80 bpm. He denies any dizziness or syncope or shortness of breath or chest pain.  At that time, my plan was: Decrease Toprol-XL to 25 mg daily and recheck heart rate and blood pressure in one week. Check CBC to ensure that the patient is not developing anemia and low blood volume as a cause of his hypotension given his history of a GI bleed and the fact he is on aspirin. Also monitor renal function  04/18/16 Labs were unremarkable aside from his stable CKD.   No visits with results within 1 Week(s) from this visit.  Latest known visit with results is:  Office Visit on 04/12/2016  Component Date Value Ref Range Status  . WBC 04/12/2016 5.5  3.8 - 10.8 K/uL Final  . RBC 04/12/2016 4.41  4.20 - 5.80 MIL/uL Final  . Hemoglobin 04/12/2016 14.5  13.0 - 17.0 g/dL Final  . HCT 04/12/2016 44.7  38.5 - 50.0 % Final  . MCV 04/12/2016 101.4* 80.0 - 100.0 fL Final  . MCH 04/12/2016 32.9  27.0 - 33.0 pg Final  . MCHC 04/12/2016 32.4  32.0 - 36.0 g/dL Final  . RDW 04/12/2016 15.9* 11.0 - 15.0 % Final  . Platelets 04/12/2016 202  140 - 400 K/uL Final  . MPV 04/12/2016 9.6  7.5 - 12.5 fL Final  . Neutro Abs 04/12/2016 2585  1,500 - 7,800 cells/uL Final  . Lymphs Abs 04/12/2016 2200  850 - 3,900 cells/uL Final  . Monocytes Absolute 04/12/2016 495  200 - 950 cells/uL Final  . Eosinophils Absolute 04/12/2016 165  15 - 500 cells/uL Final  . Basophils Absolute 04/12/2016 55  0 - 200 cells/uL Final  . Neutrophils Relative % 04/12/2016 47  % Final  . Lymphocytes Relative 04/12/2016 40  % Final  . Monocytes Relative 04/12/2016 9  % Final  . Eosinophils Relative 04/12/2016 3  % Final  . Basophils Relative 04/12/2016 1  % Final  . Smear Review 04/12/2016 Criteria for review not met   Final  . Sodium 04/12/2016 136  135 - 146 mmol/L  Final  . Potassium 04/12/2016 4.2  3.5 - 5.3 mmol/L Final  . Chloride 04/12/2016 95* 98 - 110 mmol/L  Final  . CO2 04/12/2016 26  20 - 31 mmol/L Final  . Glucose, Bld 04/12/2016 83  70 - 99 mg/dL Final  . BUN 04/12/2016 38* 7 - 25 mg/dL Final  . Creat 04/12/2016 2.55* 0.70 - 1.11 mg/dL Final   Comment:   For patients > or = 81 years of age: The upper reference limit for Creatinine is approximately 13% higher for people identified as African-American.     . Total Bilirubin 04/12/2016 1.1  0.2 - 1.2 mg/dL Final  . Alkaline Phosphatase 04/12/2016 71  40 - 115 U/L Final  . AST 04/12/2016 17  10 - 35 U/L Final  . ALT 04/12/2016 7* 9 - 46 U/L Final  . Total Protein 04/12/2016 7.1  6.1 - 8.1 g/dL Final  . Albumin 04/12/2016 3.5* 3.6 - 5.1 g/dL Final  . Calcium 04/12/2016 8.8  8.6 - 10.3 mg/dL Final  . GFR, Est African American 04/12/2016 25* >=60 mL/min Final  . GFR, Est Non African American 04/12/2016 22* >=60 mL/min Final  Patient's heart rate is still well controlled between 70 and 80 bpm. However his blood pressure is much better at 110/74 since reducing his Toprol-XL dose to 25 mg a day. He denies any palpitations or syncope or shortness of breath. He has lost 4 pounds since his last office visit. His albumin is low. His creatinine is also up slightly from 2.1 suggesting dehydration and poor by mouth intake.  At that time, my plan was: Continue Toprol-XL 25 mg a day and recheck in 3 months. I encouraged his wife to give the patient ensure 1 can with breakfast and one can with supper to address his protein calorie malnutrition. I recommended reducing his dose of Lasix to 40 mg a day for 3 days and then resuming his previous dose as he does appear slightly dehydrated  06/13/16 Wife is here for a second opinion. Recently the patient went to a podiatrist who recommended he see a vascular surgeon. The wife wants to know why. On examination, the patient does have +1 pitting edema in both legs distal  to the knee with chronic venous stasis changes and numerous varicose veins. There is a purplish discoloration to his legs distal to his knee which appears to be due to venous congestion. His feet are cool to the touch however there is a palpable dorsalis pedis and posterior tibialis pulse bilaterally. There are no obvious ulcers. He denies any symptoms of claudication or pain however he is a poor historian due to dementia  Past Medical History:  Diagnosis Date  . Atrial flutter ([place]) 01/11/2016  . Blood transfusion without reported diagnosis   . CKD (chronic kidney disease) stage 4, GFR 15-29 ml/min ([facility])   . Dementia    Moderate per Dr Tish Frederickson- patient and family deny  . Essential hypertension 01/11/2016  . GI bleed   . Gout   . Hypertension   . Kidney disease    born with one kidney  . Macular degeneration of both eyes   . Malignant melanoma ([place])    [facility]  . Prostate cancer ([place]) 2012   Past Surgical History:  Procedure Laterality Date  . CARDIAC CATHETERIZATION     Cataract  . cataract surgery Bilateral    6-7 yrs ago  . ESOPHAGOGASTRODUODENOSCOPY N/A 09/19/2014   Procedure: ESOPHAGOGASTRODUODENOSCOPY (EGD);  Surgeon: [name], MD;  Location: [facility] ENDOSCOPY;  Service: Endoscopy;  Laterality: N/A;  . SKIN DEBRIDEMENT Left 11/03/2015   Procedure:  SKIN GRAFT TO  LEFT CHEEK;  Surgeon: [name], DO;  Location: [place];  Service: Plastics;  Laterality: Left;   Current Outpatient Prescriptions on File Prior to Visit  Medication Sig Dispense Refill  . allopurinol (ZYLOPRIM) 100 MG tablet Take 1 tablet (100 mg total) by mouth daily. 30 tablet 5  . aspirin 81 MG tablet Take 81 mg by mouth daily.    . furosemide (LASIX) 80 MG tablet Take 1 tablet (80 mg total) by mouth 2 (two) times daily. (Patient taking differently: Take 80 mg by mouth daily. ) 60 tablet 3  . metoprolol succinate (TOPROL-XL) 25 MG 24 hr tablet TAKE 1 AND 1/2 TABLET BY MOUTH DAILY (Patient taking differently:  Take 25 mg by mouth daily. TAKE 1 AND 1/2 TABLET BY MOUTH DAILY) 45 tablet 5  . pantoprazole (PROTONIX) 40 MG tablet Take 1 tablet (40 mg total) by mouth daily. 30 tablet 11   No current facility-administered medications on file prior to visit.    Allergies  Allergen Reactions  . Sulfa Antibiotics Hives, Itching and Rash  . Sulfamethoxazole Hives, Itching and Rash  . Augmentin [Amoxicillin-Pot Clavulanate] Nausea Only and Other (See Comments)    Stomach pain Has patient had a PCN reaction causing immediate rash, facial/tongue/throat swelling, SOB or lightheadedness with hypotension: no Has patient had a PCN reaction causing severe rash involving mucus membranes or skin necrosis: no Has patient had a PCN reaction that required hospitalization no Has patient had a PCN reaction occurring within the last 10 years: no If all of the above answers are "NO", then may proceed with Cephalos   Social History   Social History  . Marital status: Married    Spouse name: betty  . Number of children: 1  . Years of education: 8   Occupational History  . retired     Marine scientist   Social History Main Topics  . Smoking status: Former Smoker    Types: Cigarettes    Quit date: 11/20/1978  . Smokeless tobacco: Never Used  . Alcohol use 4.2 oz/week    7 Shots of liquor per week     Comment: make be a drink before bed  . Drug use: No  . Sexual activity: Not Currently   Other Topics Concern  . Not on file   Social History Narrative   Lives at home with wife    caffeine use - decaff tea   Family History  Problem Relation Age of Onset  . Heart disease Mother   . Cancer Paternal Grandfather      Review of Systems  All other systems reviewed and are negative.      Objective:   Physical Exam  Constitutional: He appears well-developed and well-nourished.  Neck: Neck supple. No JVD present. No thyromegaly present.  Cardiovascular: Normal rate and normal heart sounds.  An irregular rhythm  present.  No murmur heard. Pulmonary/Chest: Effort normal. No respiratory distress. He has wheezes. He has rales. He exhibits no tenderness.  Abdominal: Soft. Bowel sounds are normal. He exhibits no distension and no mass. There is no tenderness. There is no rebound and no guarding.  Musculoskeletal: Normal range of motion. He exhibits no edema.  Lymphadenopathy:    He has no cervical adenopathy.  Neurological: He is alert. He exhibits normal muscle tone. Coordination normal.  Skin: Rash noted.  Psychiatric: Cognition and memory are impaired.  Vitals reviewed.         Assessment & Plan:  Weak pulse Patient has chronic venous  insufficiency and swelling in the legs with venous congestion. He also has weak peripheral pulses although they are present. I have recommended that they continue to keep the appointment with the vascular surgeon. They can perform arterial Dopplers at that point. I see no urgent indication for revascularization. I believe the color in his feet is actually due to venous congestion rather than arterial insufficiency

## 2016-06-14 ENCOUNTER — Encounter: Payer: Self-pay | Admitting: Cardiovascular Disease

## 2016-06-14 ENCOUNTER — Ambulatory Visit (INDEPENDENT_AMBULATORY_CARE_PROVIDER_SITE_OTHER): Payer: PPO | Admitting: Cardiovascular Disease

## 2016-06-14 VITALS — BP 111/74 | HR 131 | Ht 68.0 in | Wt 172.8 lb

## 2016-06-14 DIAGNOSIS — I10 Essential (primary) hypertension: Secondary | ICD-10-CM

## 2016-06-14 DIAGNOSIS — I739 Peripheral vascular disease, unspecified: Secondary | ICD-10-CM | POA: Diagnosis not present

## 2016-06-14 NOTE — Progress Notes (Signed)
06/14/2016 Anthony Sparks   29-Jan-1928  CG:[id number]  Primary Physician Odette Fraction, MD Primary Cardiologist: Lorretta Harp MD Renae Gloss  HPI:  Mr. Anthony Sparks is an 83 year old moderately overweight married Caucasian male accompanied by his son Anthony Sparks and wife Anthony Sparks. He was referred by Dr. Daylene Katayama, his podiatrist, for peripheral vascular evaluation because of discoloration in both of his legs which she's had for years. His PCP is Dr. Dennard Schaumann and cardiologist Dr. Oval Linsey. He does have a history of A. fib poorly rate controlled on oral beta blocker with soft blood pressure. He is not anticoagulated because of prior history of GI bleed and dementia. He does have moderate chronic renal insufficiency with serum creatinines run in the mid 2 range. He is minimally ambulatory and basically does not get out of the house much. He denies claudication. His legs are discolored but this has been a chronic issue for many many years.   Current Outpatient Prescriptions  Medication Sig Dispense Refill  . allopurinol (ZYLOPRIM) 100 MG tablet Take 1 tablet (100 mg total) by mouth daily. 30 tablet 5  . furosemide (LASIX) 80 MG tablet Take 1 tablet (80 mg total) by mouth 2 (two) times daily. (Patient taking differently: Take 80 mg by mouth daily. ) 60 tablet 3  . furosemide (LASIX) 80 MG tablet Take 80 mg by mouth as directed.    . metoprolol tartrate (LOPRESSOR) 25 MG tablet Take 25 mg by mouth daily.    . pantoprazole (PROTONIX) 40 MG tablet Take 1 tablet (40 mg total) by mouth daily. 30 tablet 11   No current facility-administered medications for this visit.     Allergies  Allergen Reactions  . Sulfa Antibiotics Hives, Itching and Rash  . Sulfamethoxazole Hives, Itching and Rash  . Augmentin [Amoxicillin-Pot Clavulanate] Nausea Only and Other (See Comments)    Stomach pain Has patient had a PCN reaction causing immediate rash, facial/tongue/throat swelling, SOB or  lightheadedness with hypotension: no Has patient had a PCN reaction causing severe rash involving mucus membranes or skin necrosis: no Has patient had a PCN reaction that required hospitalization no Has patient had a PCN reaction occurring within the last 10 years: no If all of the above answers are "NO", then may proceed with Cephalos    Social History   Social History  . Marital status: Married    Spouse name: Anthony Sparks  . Number of children: 1  . Years of education: 8   Occupational History  . retired     Marine scientist   Social History Main Topics  . Smoking status: Former Smoker    Types: Cigarettes    Quit date: 11/20/1978  . Smokeless tobacco: Never Used  . Alcohol use 4.2 oz/week    7 Shots of liquor per week     Comment: make be a drink before bed  . Drug use: No  . Sexual activity: Not Currently   Other Topics Concern  . Not on file   Social History Narrative   Lives at home with wife    caffeine use - decaff tea     Review of Systems: General: negative for chills, fever, night sweats or weight changes.  Cardiovascular: negative for chest pain, dyspnea on exertion, edema, orthopnea, palpitations, paroxysmal nocturnal dyspnea or shortness of breath Dermatological: negative for rash Respiratory: negative for cough or wheezing Urologic: negative for hematuria Abdominal: negative for nausea, vomiting, diarrhea, bright red blood per rectum, melena, or hematemesis Neurologic: negative for  visual changes, syncope, or dizziness All other systems reviewed and are otherwise negative except as noted above.    Blood pressure 111/74, pulse (!) 131, height 5\' 8"  (1.727 m), weight 172 lb 12.8 oz (78.4 kg).  General appearance: alert and no distress Neck: no adenopathy, no carotid bruit, no JVD, supple, symmetrical, trachea midline and thyroid not enlarged, symmetric, no tenderness/mass/nodules Lungs: clear to auscultation bilaterally Heart: irregularly irregular  rhythm Extremities: Proposed discoloration in both feet and lower extremities in a stocking distribution. I can't palpate pedal pulses are cool to the touch.  EKG A. fib with ventricular response of 131 and nonspecific ST-T wave changes. I personally reviewed this EKG  ASSESSMENT AND PLAN:   Peripheral arterial disease [facility]) Mr. Anthony Sparks was referred by Dr. Daylene Katayama, his podiatrist, for evaluation of lower extremity discoloration and diminished pedal pulses. He has a history of A. fib not rate controlled nor on oral hydration because of prior GI bleed and dementia. Dr. Amalia Hailey sees him to trim his toenails. He is noted to have discoloration of both legs diminished pulses. He does have chronic renal insufficiency with serum creatinines in the mid 2 range. He really does not ambulate much and mostly stays in the house. His pedal pulses are faint but palpable. He really denies claudication. At his age with his comorbidities and lack of evidence of critical limb ischemia I do not think that further evaluation or intervention is warranted. I will see him back when necessary.      Lorretta Harp MD FACP,FACC,FAHA, [facility] 06/14/2016 11:47 AM

## 2016-06-14 NOTE — Assessment & Plan Note (Signed)
Mr. Anthony Sparks was referred by Dr. Daylene Katayama, his podiatrist, for evaluation of lower extremity discoloration and diminished pedal pulses. He has a history of A. fib not rate controlled nor on oral hydration because of prior GI bleed and dementia. Dr. Amalia Hailey sees him to trim his toenails. He is noted to have discoloration of both legs diminished pulses. He does have chronic renal insufficiency with serum creatinines in the mid 2 range. He really does not ambulate much and mostly stays in the house. His pedal pulses are faint but palpable. He really denies claudication. At his age with his comorbidities and lack of evidence of critical limb ischemia I do not think that further evaluation or intervention is warranted. I will see him back when necessary.

## 2016-06-14 NOTE — Patient Instructions (Signed)
Medication Instructions: Your physician recommends that you continue on your current medications as directed. Please refer to the Current Medication list given to you today.   Follow-Up: Your physician recommends that you schedule a follow-up appointment as needed with Dr. Berry.    

## 2016-06-19 ENCOUNTER — Ambulatory Visit: Payer: PPO | Admitting: Cardiovascular Disease

## 2016-06-29 ENCOUNTER — Ambulatory Visit (INDEPENDENT_AMBULATORY_CARE_PROVIDER_SITE_OTHER): Payer: PPO | Admitting: Physician Assistant

## 2016-06-29 ENCOUNTER — Encounter (HOSPITAL_COMMUNITY): Payer: Self-pay | Admitting: Emergency Medicine

## 2016-06-29 ENCOUNTER — Emergency Department (HOSPITAL_COMMUNITY)
Admission: EM | Admit: 2016-06-29 | Discharge: 2016-07-20 | Disposition: E | Payer: PPO | Attending: Emergency Medicine | Admitting: Emergency Medicine

## 2016-06-29 DIAGNOSIS — R401 Stupor: Secondary | ICD-10-CM | POA: Diagnosis not present

## 2016-06-29 DIAGNOSIS — R319 Hematuria, unspecified: Secondary | ICD-10-CM | POA: Diagnosis not present

## 2016-06-29 DIAGNOSIS — K921 Melena: Secondary | ICD-10-CM | POA: Diagnosis not present

## 2016-06-29 DIAGNOSIS — I469 Cardiac arrest, cause unspecified: Secondary | ICD-10-CM | POA: Diagnosis not present

## 2016-06-29 DIAGNOSIS — R34 Anuria and oliguria: Secondary | ICD-10-CM

## 2016-06-29 DIAGNOSIS — Z8719 Personal history of other diseases of the digestive system: Secondary | ICD-10-CM

## 2016-06-29 DIAGNOSIS — Z85828 Personal history of other malignant neoplasm of skin: Secondary | ICD-10-CM | POA: Diagnosis not present

## 2016-06-29 DIAGNOSIS — I4892 Unspecified atrial flutter: Secondary | ICD-10-CM

## 2016-06-29 DIAGNOSIS — Z79899 Other long term (current) drug therapy: Secondary | ICD-10-CM | POA: Diagnosis not present

## 2016-06-29 DIAGNOSIS — I129 Hypertensive chronic kidney disease with stage 1 through stage 4 chronic kidney disease, or unspecified chronic kidney disease: Secondary | ICD-10-CM | POA: Insufficient documentation

## 2016-06-29 DIAGNOSIS — Z8546 Personal history of malignant neoplasm of prostate: Secondary | ICD-10-CM | POA: Insufficient documentation

## 2016-06-29 DIAGNOSIS — Z87891 Personal history of nicotine dependence: Secondary | ICD-10-CM | POA: Insufficient documentation

## 2016-06-29 DIAGNOSIS — N184 Chronic kidney disease, stage 4 (severe): Secondary | ICD-10-CM | POA: Diagnosis not present

## 2016-06-29 DIAGNOSIS — R404 Transient alteration of awareness: Secondary | ICD-10-CM | POA: Diagnosis not present

## 2016-06-29 DIAGNOSIS — R531 Weakness: Secondary | ICD-10-CM | POA: Diagnosis not present

## 2016-06-29 LAB — COMPREHENSIVE METABOLIC PANEL
ALK PHOS: 58 U/L (ref 38–126)
ALT: 19 U/L (ref 17–63)
ANION GAP: 20 — AB (ref 5–15)
AST: 47 U/L — ABNORMAL HIGH (ref 15–41)
Albumin: 2.7 g/dL — ABNORMAL LOW (ref 3.5–5.0)
BILIRUBIN TOTAL: 0.6 mg/dL (ref 0.3–1.2)
BUN: 113 mg/dL — ABNORMAL HIGH (ref 6–20)
CALCIUM: 7.6 mg/dL — AB (ref 8.9–10.3)
CO2: 21 mmol/L — ABNORMAL LOW (ref 22–32)
Chloride: 87 mmol/L — ABNORMAL LOW (ref 101–111)
Creatinine, Ser: 5.66 mg/dL — ABNORMAL HIGH (ref 0.61–1.24)
GFR, EST AFRICAN AMERICAN: 9 mL/min — AB (ref >60)
GFR, EST NON AFRICAN AMERICAN: 8 mL/min — AB (ref >60)
GLUCOSE: 78 mg/dL (ref 65–99)
POTASSIUM: 4.9 mmol/L (ref 3.5–5.1)
Sodium: 128 mmol/L — ABNORMAL LOW (ref 135–145)
TOTAL PROTEIN: 6.1 g/dL — AB (ref 6.5–8.1)

## 2016-06-29 LAB — CBC
HCT: 40.9 % (ref 39.0–52.0)
HEMATOCRIT: 39.7 % (ref 38.5–50.0)
HEMOGLOBIN: 13.4 g/dL (ref 13.0–17.0)
Hemoglobin: 13.1 g/dL (ref 13.0–17.0)
MCH: 33.8 pg — ABNORMAL HIGH (ref 27.0–33.0)
MCH: 33.2 pg (ref 26.0–34.0)
MCHC: 33 g/dL (ref 32.0–36.0)
MCHC: 32.8 g/dL (ref 30.0–36.0)
MCV: 102.3 fL — AB (ref 80.0–100.0)
MCV: 101.2 fL — ABNORMAL HIGH (ref 78.0–100.0)
Platelets: 142 10*3/uL (ref 140–400)
Platelets: 142 10*3/uL — ABNORMAL LOW (ref 150–400)
RBC: 3.88 MIL/uL — AB (ref 4.20–5.80)
RBC: 4.04 MIL/uL — AB (ref 4.22–5.81)
RDW: 14.4 % (ref 11.0–15.0)
RDW: 13.7 % (ref 11.5–15.5)
WBC: 9.1 10*3/uL (ref 3.8–10.8)
WBC: 10.7 10*3/uL — AB (ref 4.0–10.5)

## 2016-06-29 MED ORDER — EPINEPHRINE PF 1 MG/10ML IJ SOSY
PREFILLED_SYRINGE | INTRAMUSCULAR | Status: AC | PRN
  Administered 2016-06-29 (×8): 1 mg via INTRAVENOUS

## 2016-06-29 MED ORDER — SODIUM BICARBONATE 8.4 % IV SOLN
INTRAVENOUS | Status: AC | PRN
  Administered 2016-06-29 (×3): 50 meq via INTRAVENOUS

## 2016-06-29 NOTE — ED Notes (Signed)
Pt was manually ventilated 100% FiO2 throughout CODE BLUE. ACLS protocol followed.

## 2016-06-29 NOTE — Code Documentation (Signed)
Patient time of death occurred at 2153-09-17.

## 2016-06-29 NOTE — Code Documentation (Signed)
Patient placed on Zoll pads 

## 2016-06-29 NOTE — Progress Notes (Signed)
Patient ID: Anthony Sparks MRN: [id number], DOB: Sep 08, 1927, 83 y.o. Date of Encounter: @DATE @  Chief Complaint:  Chief Complaint  Patient presents with  . Hematuria    x1wk  . hard to urinate  . Diarrhea  . Altered Mental Status  . rectal sore    HPI: 83 y.o. year old male  presents with his wife and his son for above.   Reviewed his prior office notes with Dr. Dennard Schaumann 06/13/16 and also his note with Dr. Gwenlyn Found from 06/14/16. Has extensive history including history of GI bleed and dementia.  Son reports that on Tuesday he saw blood in patient's urine. Says that the toilet was bright red.  Wife reports that yesterday his stool was black.  Wife also reports that today he was wearing a pad and she saw some blood on the pad.  They state that since being on Lasix-- usually after taking Lasix, he will urinate a lot--- but hasn't been seeming to urinate quite so much recently.  I asked about the rectal sore-- and they state that he has had decubitus sores for the past year. State that they have another appointment scheduled to see Dr. Dennard Schaumann on Monday for follow-up of his long-term chronic medical problems.   Past Medical History:  Diagnosis Date  . Atrial flutter ([place]) 01/11/2016  . Blood transfusion without reported diagnosis   . CKD (chronic kidney disease) stage 4, GFR 15-29 ml/min (HCC)   . Dementia    Moderate per Dr Tish Frederickson- patient and family deny  . Essential hypertension 01/11/2016  . GI bleed   . Gout   . Hypertension   . Kidney disease    born with one kidney  . Macular degeneration of both eyes   . Malignant melanoma ([place])    MOHS  . Prostate cancer [facility]) 2012     Home Meds: Outpatient Medications Prior to Visit  Medication Sig Dispense Refill  . allopurinol (ZYLOPRIM) 100 MG tablet Take 1 tablet (100 mg total) by mouth daily. 30 tablet 5  . furosemide (LASIX) 80 MG tablet Take 1 tablet (80 mg total) by mouth 2 (two) times daily. (Patient taking  differently: Take 80 mg by mouth daily. ) 60 tablet 3  . furosemide (LASIX) 80 MG tablet Take 80 mg by mouth as directed.    . metoprolol tartrate (LOPRESSOR) 25 MG tablet Take 25 mg by mouth daily.    . pantoprazole (PROTONIX) 40 MG tablet Take 1 tablet (40 mg total) by mouth daily. 30 tablet 11   No facility-administered medications prior to visit.     Allergies:  Allergies  Allergen Reactions  . Sulfa Antibiotics Hives, Itching and Rash  . Sulfamethoxazole Hives, Itching and Rash  . Augmentin [Amoxicillin-Pot Clavulanate] Nausea Only and Other (See Comments)    Stomach pain Has patient had a PCN reaction causing immediate rash, facial/tongue/throat swelling, SOB or lightheadedness with hypotension: no Has patient had a PCN reaction causing severe rash involving mucus membranes or skin necrosis: no Has patient had a PCN reaction that required hospitalization no Has patient had a PCN reaction occurring within the last 10 years: no If all of the above answers are "NO", then may proceed with Cephalos    Social History   Social History  . Marital status: Married    Spouse name: betty  . Number of children: 1  . Years of education: 8   Occupational History  . retired     Marine scientist   Social History  Main Topics  . Smoking status: Former Smoker    Types: Cigarettes    Quit date: 11/20/1978  . Smokeless tobacco: Never Used  . Alcohol use 4.2 oz/week    7 Shots of liquor per week     Comment: make be a drink before bed  . Drug use: No  . Sexual activity: Not Currently   Other Topics Concern  . Not on file   Social History Narrative   Lives at home with wife    caffeine use - decaff tea    Family History  Problem Relation Age of Onset  . Heart disease Mother   . Cancer Paternal Grandfather      Review of Systems:  See HPI for pertinent ROS. All other ROS negative.    Physical Exam: General: WM in wheelchair. Flat affect. Appears in no acute distress. Neck: Supple. No  thyromegaly. No lymphadenopathy. Lungs: Clear bilaterally to auscultation without wheezes, rales, or rhonchi. Breathing is unlabored. Heart: Irregular.  Musculoskeletal:  In wheelchair.  Extremities/Skin: Chronic venous stasis. Edema.  Neuro: In wheelchair. Dementia. Psych:  Quiet, flat affect.   Results for orders placed or performed in visit on 06/27/2016  CBC  Result Value Ref Range   WBC 9.1 3.8 - 10.8 K/uL   RBC 3.88 (L) 4.20 - 5.80 MIL/uL   Hemoglobin 13.1 13.0 - 17.0 g/dL   HCT 39.7 38.5 - 50.0 %   MCV 102.3 (H) 80.0 - 100.0 fL   MCH 33.8 (H) 27.0 - 33.0 pg   MCHC 33.0 32.0 - 36.0 g/dL   RDW 14.4 11.0 - 15.0 %   Platelets 142 140 - 400 K/uL     ASSESSMENT AND PLAN:  83 y.o. year old male with  1. Hematuria, unspecified type Ran CBC here---H/H stable.  No sign of significant blood loss anemia secondary to hematuria and melena.  Pt was unable to leave a urine sample. Gave him cup of water but still unable to leave urine sample. Ultimately gave them a "hat" and a urine specimen container for them to obtain at home and bring back in. - Urinalysis, Routine w reflex microscopic - Urine culture - Urinalysis, Routine w reflex microscopic - CBC  2. Melena Ran CBC here---H/H stable.  No sign of significant blood loss anemia secondary to hematuria and melena. - CBC  3. History of lower GI bleeding Ran CBC here---H/H stable.  No sign of significant blood loss anemia secondary to hematuria and melena. - CBC  4. Decreased urine output Has CKD --- check CMET - COMPLETE METABOLIC PANEL WITH GFR  5. Atrial flutter, unspecified type ([place]) No anticoag given h/o GI Bleed, dementia (noncompliance sec to dementia)  6. CKD (chronic kidney disease) stage 4, GFR 15-29 ml/min (HCC) - COMPLETE METABOLIC PANEL WITH GFR  Will f/u with them once obtain UA and lab results.  Also he has follow-up appointment scheduled with Dr. Dennard Schaumann for Monday.   [address] [place], [place],  [facility] 06/23/2016 3:31 PM

## 2016-06-29 NOTE — Progress Notes (Signed)
   07/09/2016 2109  Clinical Encounter Type  Visited With Patient and family together;Health care provider  Visit Type Initial;Psychological support;Spiritual support  Referral From Nurse  Consult/Referral To Chaplain  Recommendations continue support   Spiritual Encounters  Spiritual Needs Prayer;Emotional;Grief support  Stress Factors  Patient Stress Factors None identified  Family Stress Factors Exhausted;Loss  Advance Directives (For Healthcare)  Does Patient Have a Medical Advance Directive? No  Would patient like information on creating a medical advance directive? No - Patient declined  [place]  Does Patient Have a Mental Health Advance Directive? No  Would patient like information on creating a mental health advance directive? No - Patient declined    Chaplain paged to [place] in ed and eventual death. Provided ministry of support, prayer, and presence.

## 2016-06-29 NOTE — ED Notes (Signed)
PA-C at bedside 

## 2016-06-29 NOTE — ED Triage Notes (Signed)
Per [facility]: Pt to ED from home with AMS, LKW 3 days ago. Pt family states black, tarry stools x 4 days, R side of abdomen distended, guarding, but pt denies pain. Pt family states they think he has dementia, but no known diagnosis. EMS VS: 105/87, HR 84 A-Fib, CBG 104.

## 2016-06-29 NOTE — ED Provider Notes (Signed)
Emergency Department Provider Note   I have reviewed the triage vital signs and the nursing notes.   HISTORY  Chief Complaint Altered Mental Status   HPI Anthony Sparks is a 83 y.o. male with PMH of a-flutter, CKD, GI bleed, and HTN presents to the emergency department with confusion and black, tarry stools for the last 4 days. Family report worsening confusion over the last 2 days. They went to the PCP today but were reportedly unable to be seen and returned home at which point EMS was called.   Level 5 caveat: Patient with significant confusion and hypotension. Unable to provide history.   Past Medical History:  Diagnosis Date  . Atrial flutter ([place]) 01/11/2016  . Blood transfusion without reported diagnosis   . CKD (chronic kidney disease) stage 4, GFR 15-29 ml/min (HCC)   . Dementia    Moderate per Dr Tish Frederickson- patient and family deny  . Essential hypertension 01/11/2016  . GI bleed   . Gout   . Hypertension   . Kidney disease    born with one kidney  . Macular degeneration of both eyes   . Malignant melanoma ([place])    MOHS  . Prostate cancer [facility]) 2012    Patient Active Problem List   Diagnosis Date Noted  . Peripheral arterial disease ([place]) 06/14/2016  . CKD (chronic kidney disease) stage 4, GFR 15-29 ml/min (HCC)   . Atrial flutter ([place]) 01/11/2016  . Essential hypertension 01/11/2016  . Malignant melanoma ([place])   . Melanoma of skin ([place]) 11/03/2015  . Gout   . GI bleed 09/19/2014  . GI bleeding 09/19/2014    Past Surgical History:  Procedure Laterality Date  . CARDIAC CATHETERIZATION     Cataract  . cataract surgery Bilateral    6-7 yrs ago  . ESOPHAGOGASTRODUODENOSCOPY N/A 09/19/2014   Procedure: ESOPHAGOGASTRODUODENOSCOPY (EGD);  Surgeon: [name], MD;  [facility] ENDOSCOPY;  Service: Endoscopy;  Laterality: N/A;  . SKIN DEBRIDEMENT Left 11/03/2015   Procedure:  SKIN GRAFT TO LEFT CHEEK;  Surgeon: [name], DO;  Location: [facility];  Service: Plastics;  Laterality: Left;    Current Outpatient Rx  . Order #: [id number] Class: Normal  . Order #: [id number] Class: Normal  . Order #: [id number] Class: Historical Med  . Order #: [id number] Class: Historical Med  . Order #: [id number] Class: Normal    Allergies Sulfa antibiotics; Sulfamethoxazole; and Augmentin [amoxicillin-pot clavulanate]  Family History  Problem Relation Age of Onset  . Heart disease Mother   . Cancer Paternal Grandfather     Social History Social History  Substance Use Topics  . Smoking status: Former Smoker    Types: Cigarettes    Quit date: 11/20/1978  . Smokeless tobacco: Never Used  . Alcohol use 4.2 oz/week    7 Shots of liquor per week     Comment: make be a drink before bed    Review of Systems  Level 5 caveat: Hypotension and confusion.   ____________________________________________   PHYSICAL EXAM:  VITAL SIGNS: ED Triage Vitals  Enc Vitals Group     BP 07/08/2016 2100 (!) 85/70     Pulse Rate 07/09/2016 2100 80     Resp 07/16/2016 2100 11     SpO2 07/08/2016 2100 100 %     Pain Score 07/05/2016 2108 0   Constitutional: Confused and pale appearing.  Eyes: Pupils are sluggish.  Head: Atraumatic. Nose: No congestion/rhinnorhea. Mouth/Throat: Mucous membranes are moist.  Oropharynx non-erythematous. Neck: No stridor.  Cardiovascular: Bradycardia proceeding to PEA arrest. Poor peripheral circulation.   Respiratory: Labored, infrequent breathing. CTABL.  Gastrointestinal: Soft and nontender. No distention. Trace BRB in depends. No melena.  Musculoskeletal:  No gross deformities of extremities. Neurologic: GCS 7. No gross motor deficit.  Skin:  Skin is cool, dry and intact. No rash noted.   ____________________________________________   LABS (all labs ordered are listed, but only abnormal results are displayed)  Labs Reviewed  COMPREHENSIVE METABOLIC PANEL - Abnormal; Notable for the following:       Result Value   Sodium  128 (*)    Chloride 87 (*)    CO2 21 (*)    BUN 113 (*)    Creatinine, Ser 5.66 (*)    Calcium 7.6 (*)    Total Protein 6.1 (*)    Albumin 2.7 (*)    AST 47 (*)    GFR calc non Af Amer 8 (*)    GFR calc Af Amer 9 (*)    Anion gap 20 (*)    All other components within normal limits  CBC - Abnormal; Notable for the following:    WBC 10.7 (*)    RBC 4.04 (*)    MCV 101.2 (*)    Platelets 142 (*)    All other components within normal limits   ____________________________________________  EKG   EKG Interpretation  Date/Time:  Thursday June 29 2016 20:56:51 EST Ventricular Rate:  78 PR Interval:    QRS Duration: 81 QT Interval:  435 QTC Calculation: 496 R Axis:   46 Text Interpretation:  a fib Ventricular premature complex Low voltage, precordial leads Anteroseptal infarct, old Nonspecific T abnormalities, lateral leads No STEMI.  Confirmed by LONG MD, JOSHUA [phone]) on 07/30/2016 10:40:19 AM       ____________________________________________  RADIOLOGY  None ____________________________________________   PROCEDURES  Procedure(s) performed:   Procedures  CRITICAL CARE Performed by: Margette Fast Total critical care time: 30 minutes Critical care time was exclusive of separately billable procedures and treating other patients. Critical care was necessary to treat or prevent imminent or life-threatening deterioration. Critical care was time spent personally by me on the following activities: development of treatment plan with patient and/or surrogate as well as nursing, discussions with consultants, evaluation of patient's response to treatment, examination of patient, obtaining history from patient or surrogate, ordering and performing treatments and interventions, ordering and review of laboratory studies, ordering and review of radiographic studies, pulse oximetry and re-evaluation of patient's condition.  Nanda Quinton, MD Emergency  Medicine  ____________________________________________   INITIAL IMPRESSION / ASSESSMENT AND PLAN / ED COURSE  Pertinent labs & imaging results that were available during my care of the patient were reviewed by me and considered in my medical decision making (see chart for details).   Call to the patient's bedside with altered mental status and pressure in the 50s. He is altered and not following commands. He is breathing spontaneously but irregularly. The patient suddenly became much worse and had very labored breathing, bradycardia, and eventually lost pulses. Chest compressions were initiated immediately patient was placed on the [place] and was found to be in PEA arrest. The only history available is that he was having some bright red blood per rectum and had a history of gastrointestinal bleeding. Brief chart review shows no anticoagulant. Chest compressions were continued for multiple rounds with epinephrine and bicarbonate. Patient briefly regained a weak, irregular pulse but shortly after went back into cardiac arrest. At this time family  came to bedside and I updated him on the situation. They stated that he seemed confused and feeling poorly over the last 2-3 days and had multiple falls along with gastrointestinal bleeding. They noted being confused since yesterday. Their PCP was unable to see the patient today and so they returned home and called EMS.  Continued to perform ACLS the patient never regained a shockable rhythm. Patient received multiple rounds of epinepherine and bicarb (see nursing code documentation). No purposeful activity. The resuscitation was terminated with time of death 21:55.  Spoke with Dr. Dennard Schaumann, the patient's PCP, by phone to update him regarding the patient. He will complete the death certificate. Updated family in consultation room with chaplin present. Answered all questions. Patient with multiple medical co-morbidities and several days of illness. Patient not  referred to [place].  ____________________________________________  FINAL CLINICAL IMPRESSION(S) / ED DIAGNOSES  Final diagnoses:  Cardiac arrest ([place])  Stupor     MEDICATIONS GIVEN DURING THIS VISIT:  Medications  EPINEPHrine (ADRENALIN) 1 MG/10ML injection (1 mg Intravenous Given 07/09/2016 2154)  sodium bicarbonate injection (50 mEq Intravenous Given 07/04/2016 2151)     NEW OUTPATIENT MEDICATIONS STARTED DURING THIS VISIT:  None   Note:  This document was prepared using Dragon voice recognition software and may include unintentional dictation errors.  Nanda Quinton, MD Emergency Medicine   Margette Fast, MD 07/05/16 [id number]

## 2016-06-29 NOTE — ED Notes (Signed)
Family at bedside. 

## 2016-06-30 LAB — COMPLETE METABOLIC PANEL WITH GFR
ALT: 13 U/L (ref 9–46)
AST: 31 U/L (ref 10–35)
Albumin: 2.7 g/dL — ABNORMAL LOW (ref 3.6–5.1)
Alkaline Phosphatase: 60 U/L (ref 40–115)
BUN: 112 mg/dL — AB (ref 7–25)
CALCIUM: 7.2 mg/dL — AB (ref 8.6–10.3)
CO2: 18 mmol/L — AB (ref 20–31)
Chloride: 89 mmol/L — ABNORMAL LOW (ref 98–110)
Creat: 5.41 mg/dL — ABNORMAL HIGH (ref 0.70–1.11)
GFR, Est African American: 10 mL/min — ABNORMAL LOW (ref >=60)
GFR, Est Non African American: 9 mL/min — ABNORMAL LOW (ref >=60)
GLUCOSE: 79 mg/dL (ref 70–99)
POTASSIUM: 4.8 mmol/L (ref 3.5–5.3)
SODIUM: 129 mmol/L — AB (ref 135–146)
Total Bilirubin: 0.3 mg/dL (ref 0.2–1.2)
Total Protein: 5.6 g/dL — ABNORMAL LOW (ref 6.1–8.1)

## 2016-07-03 ENCOUNTER — Ambulatory Visit: Payer: PPO | Admitting: Family Medicine

## 2016-07-20 ENCOUNTER — Ambulatory Visit: Payer: PPO | Admitting: Family Medicine

## 2016-07-20 DEATH — deceased

## 2016-08-18 ENCOUNTER — Ambulatory Visit: Payer: PPO | Admitting: Podiatry

## 2016-12-15 IMAGING — CR DG ABD PORTABLE 1V
1 series · 1 of 1 positions shown · non-contrast
Comparison: None.

CLINICAL DATA: NG tube placement

EXAM:
PORTABLE ABDOMEN - 1 VIEW

[AP]
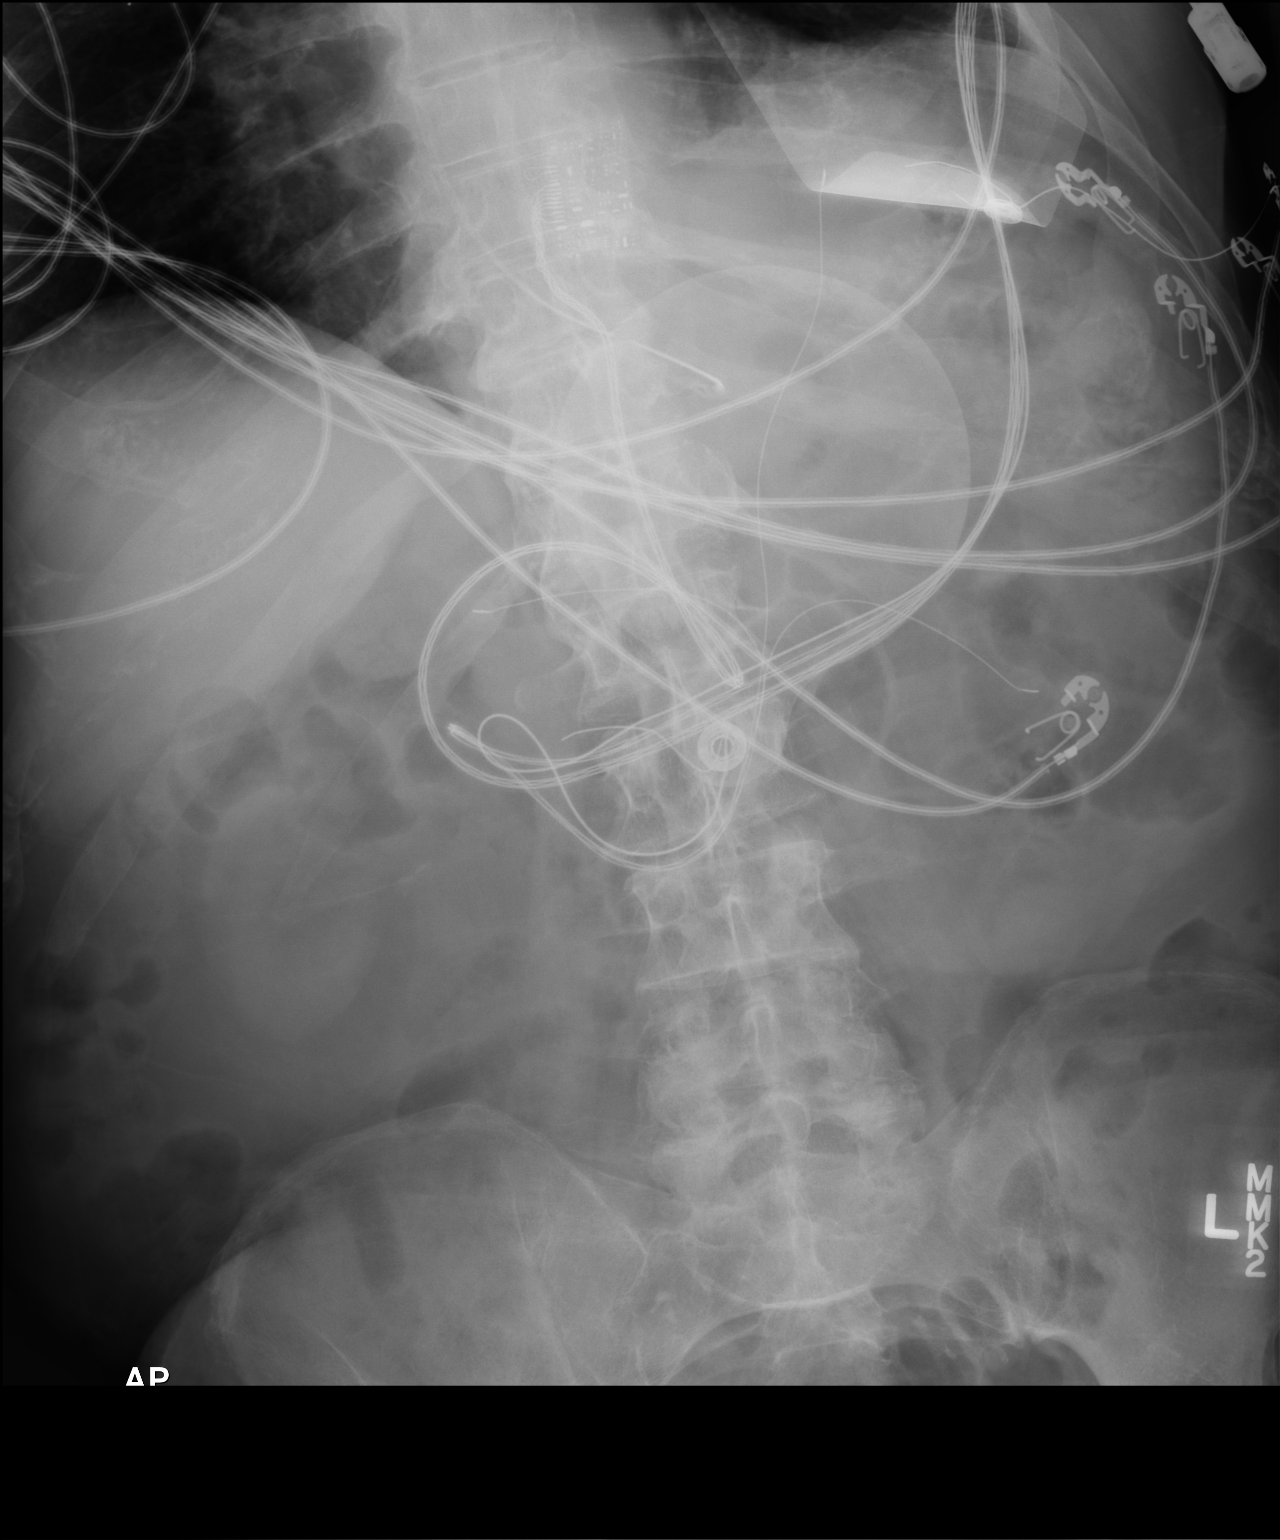

[1 of 1 positions shown; findings below may reference images not displayed]

FINDINGS: Enteric tube terminates in the gastric cardia.

None partum bowel gas pattern.

Multiple line/tubes/leads overly the patient.
IMPRESSION: Enteric tube terminates in the gastric cardia.

## 2016-12-15 IMAGING — CR DG CHEST 1V PORT
1 series · 1 of 1 positions shown · non-contrast
Comparison: 10/17/2010

CLINICAL DATA: Generalized weakness since last night, GI bleed,
cough

EXAM:
PORTABLE CHEST - 1 VIEW

[AP]
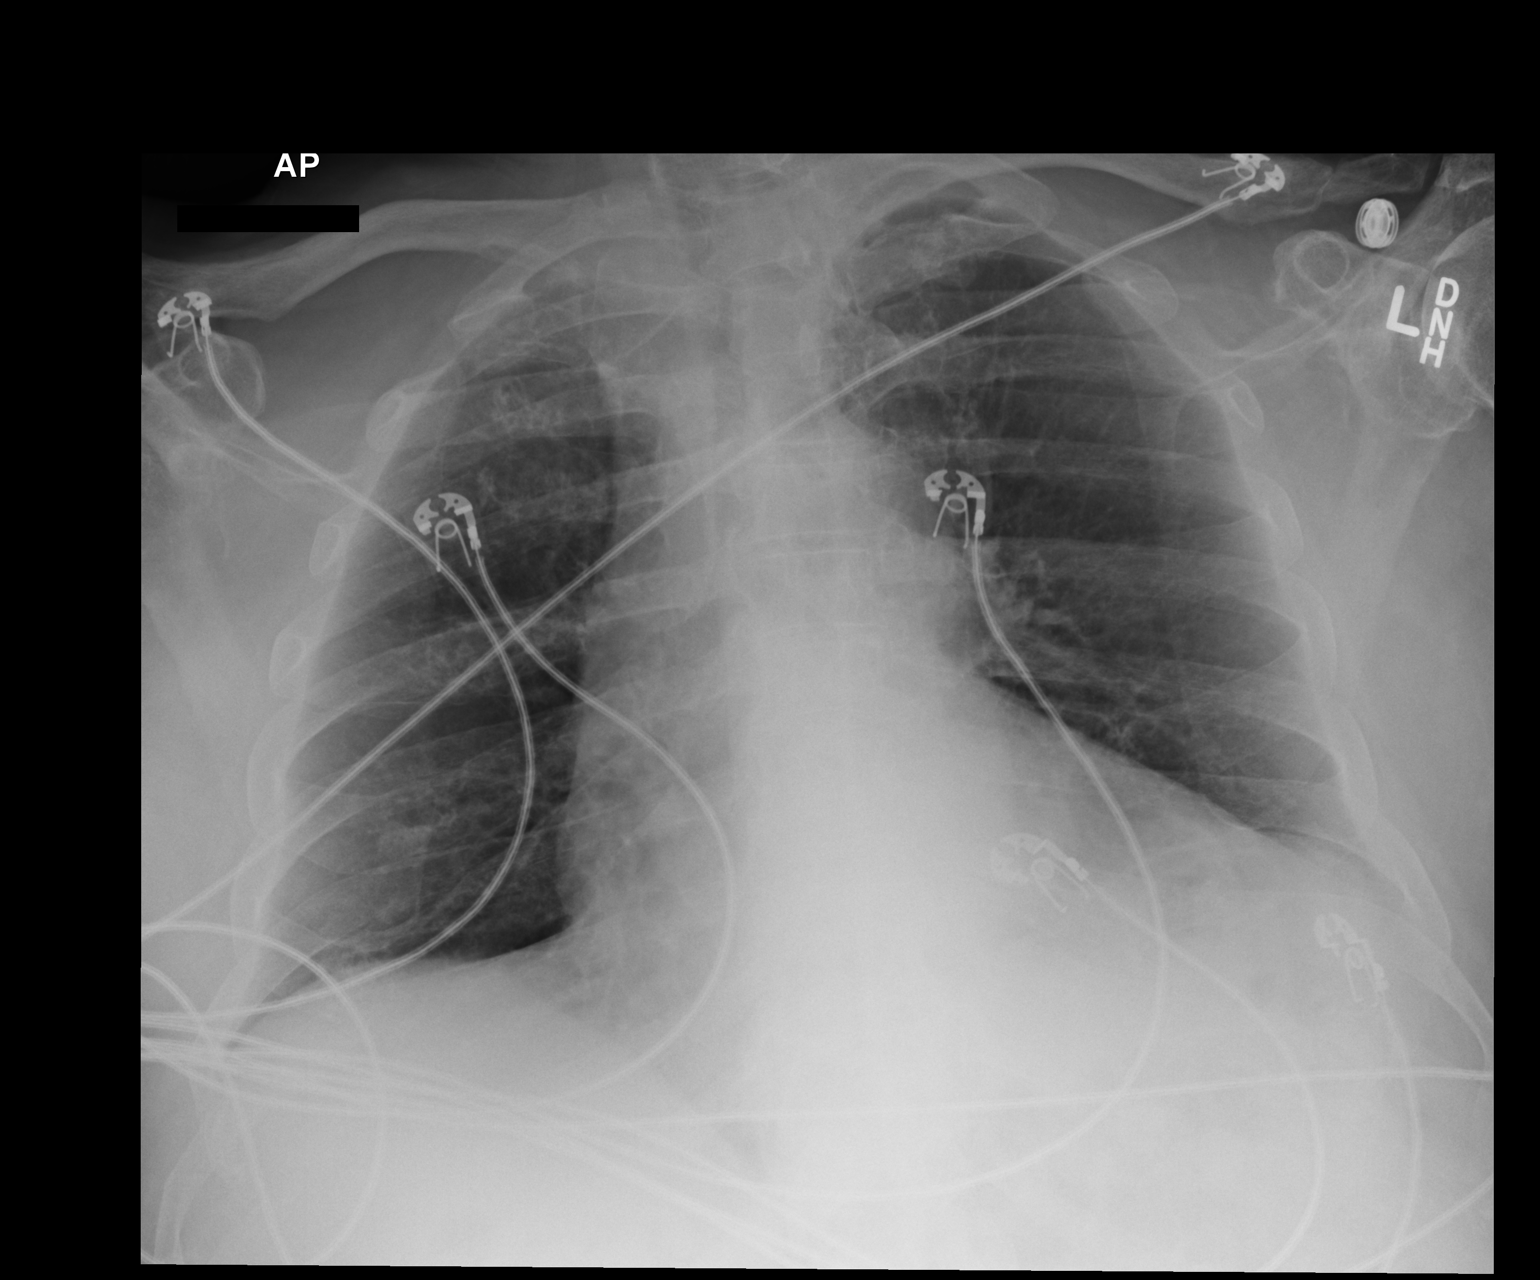

[1 of 1 positions shown; findings below may reference images not displayed]

FINDINGS: Limited inspiratory effect. Mild cardiac enlargement. Vascular
pattern normal. Right lung clear. Left lung essentially clear
although retrocardiac portion of left lower lobe not well evaluated.
IMPRESSION: No acute findings except cannot evaluate retrocardiac left lower
lobe on single AP view. Consider PA and lateral to characterize this
area better.

## 2016-12-19 IMAGING — DX DG CHEST 2V
2 series · 2 of 2 positions shown · non-contrast
Comparison: 09/19/2014

CLINICAL DATA: Coughing, wheezing, history of prostate cancer and
hypertension

EXAM:
CHEST  2 VIEW

[chest lat]
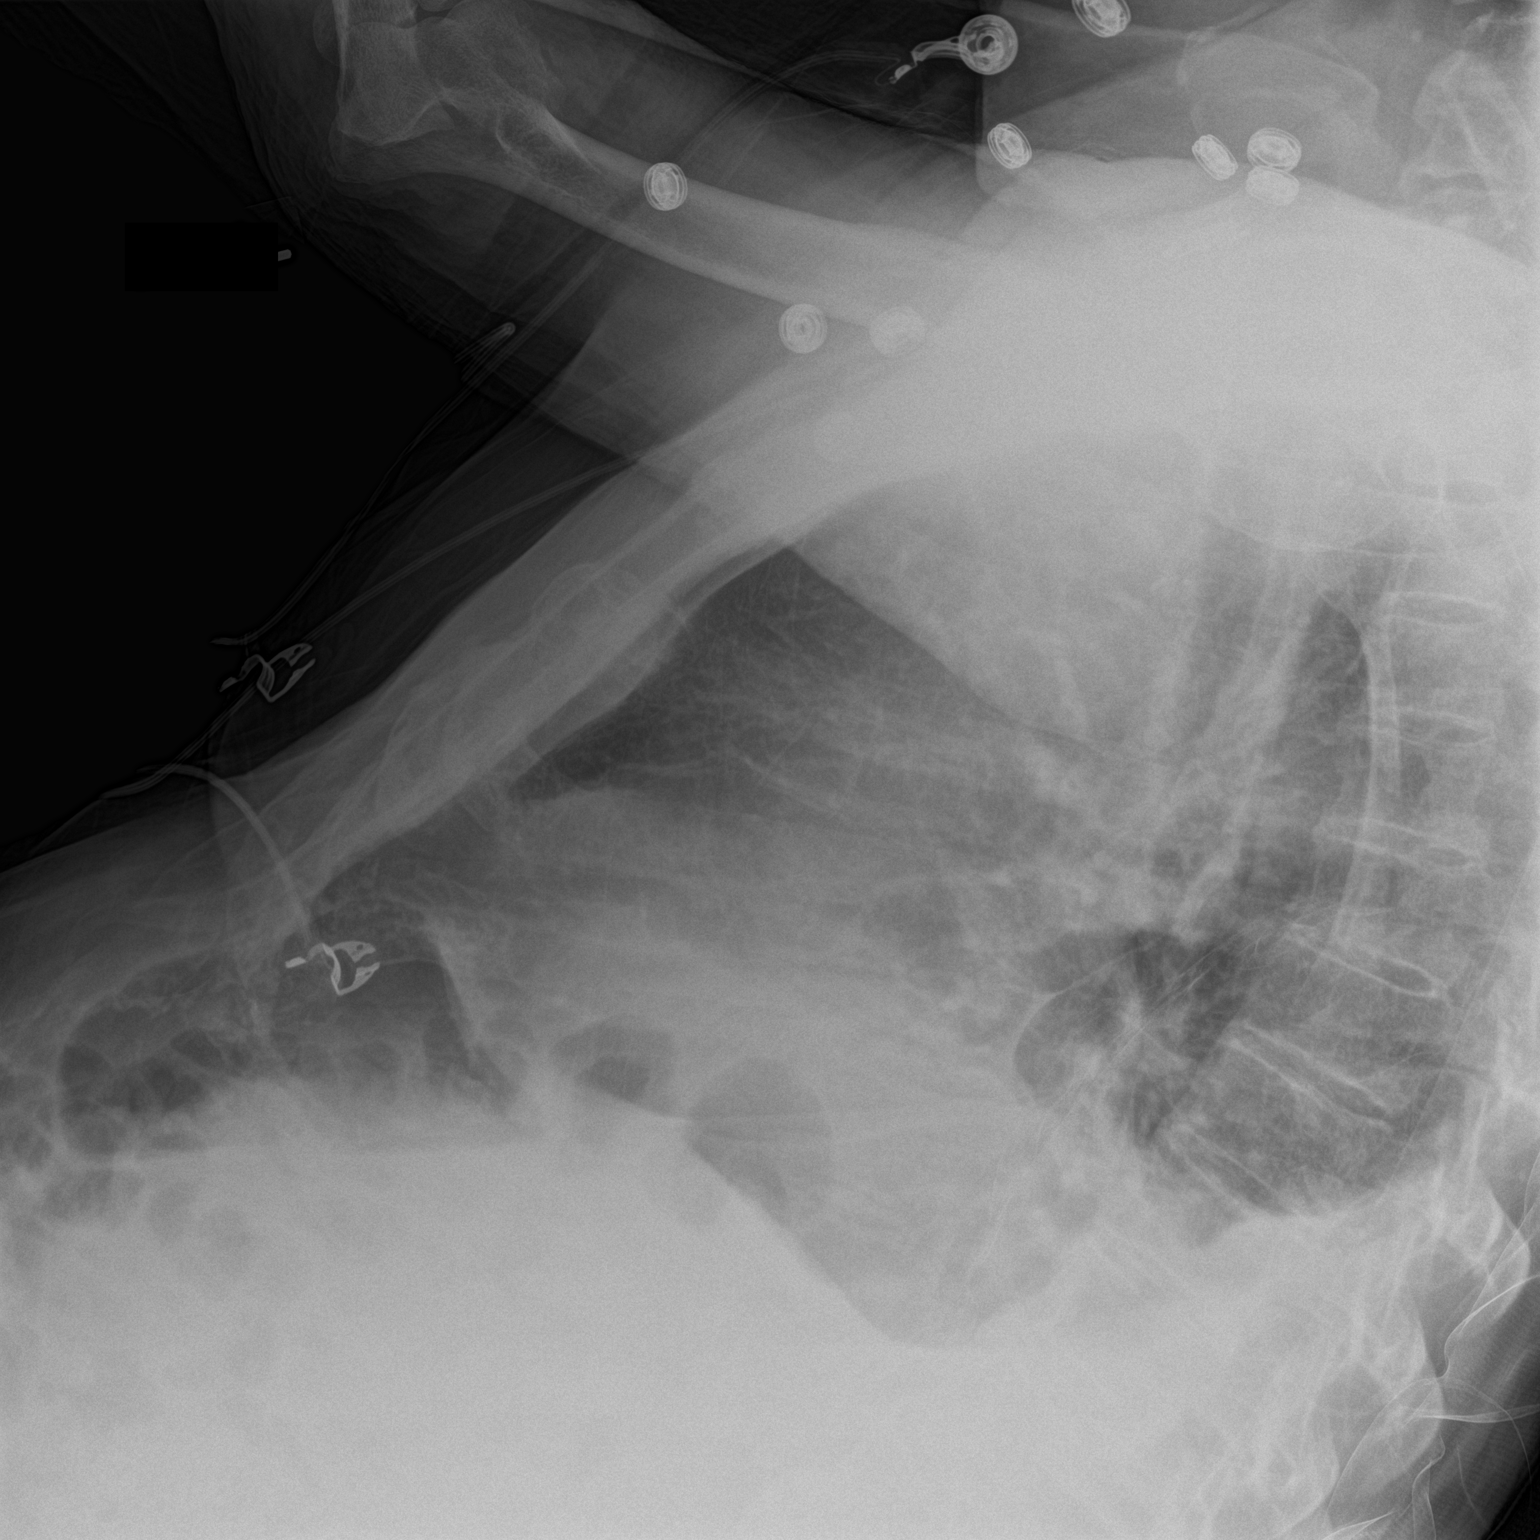

[chest ap]
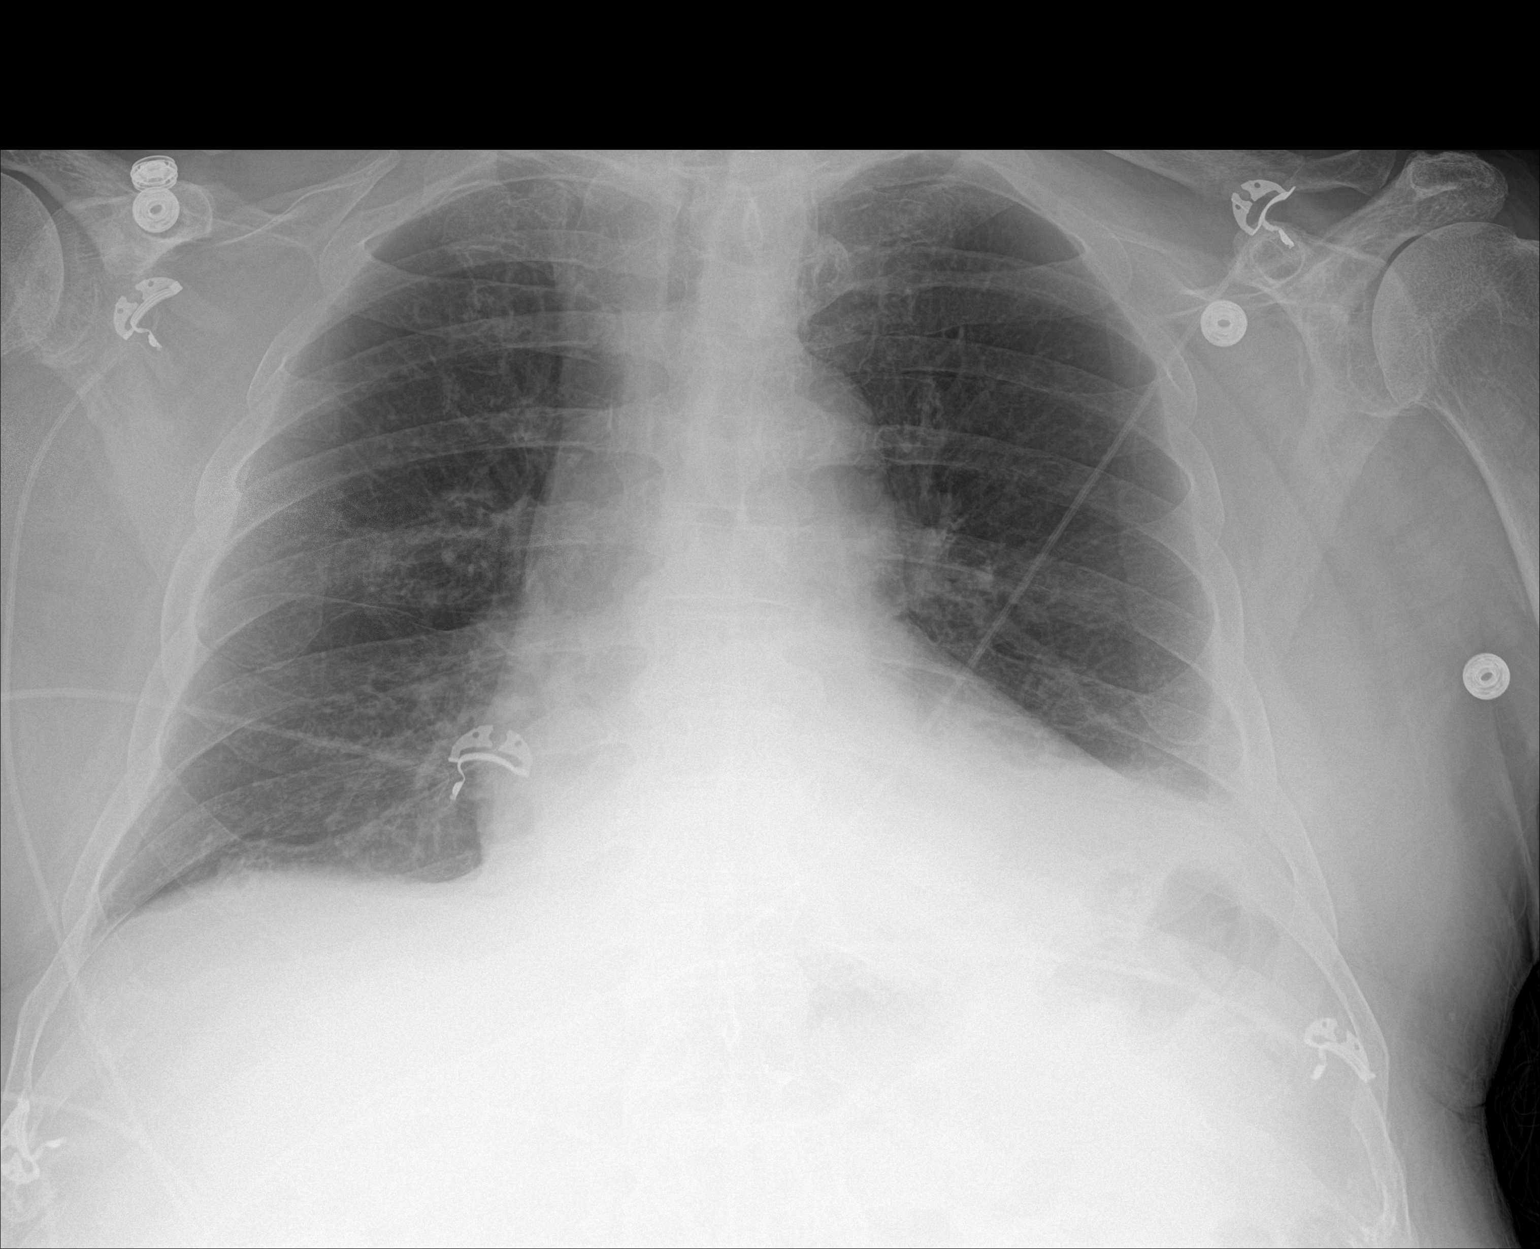

[2 of 2 positions shown; findings below may reference images not displayed]

FINDINGS: Stable mild cardiac enlargement. Vascular pattern normal. Right lung
is clear. There is left lower lobe opacity representing small
pleural effusion and underlying airspace disease.
IMPRESSION: Left lower lobe consolidation and small effusion.
